# Patient Record
Sex: Male | Born: 1950 | Race: White | Hispanic: No | Marital: Married | Smoking: Former smoker
Health system: Southern US, Community
[De-identification: ages and names within clinical notes are randomized; demographics above are authoritative.]

## PROBLEM LIST (undated history)

## (undated) DIAGNOSIS — E785 Hyperlipidemia, unspecified: Secondary | ICD-10-CM

## (undated) DIAGNOSIS — D696 Thrombocytopenia, unspecified: Secondary | ICD-10-CM

## (undated) DIAGNOSIS — I82409 Acute embolism and thrombosis of unspecified deep veins of unspecified lower extremity: Secondary | ICD-10-CM

## (undated) DIAGNOSIS — I509 Heart failure, unspecified: Secondary | ICD-10-CM

## (undated) DIAGNOSIS — J449 Chronic obstructive pulmonary disease, unspecified: Secondary | ICD-10-CM

## (undated) DIAGNOSIS — I10 Essential (primary) hypertension: Secondary | ICD-10-CM

## (undated) DIAGNOSIS — F32A Depression, unspecified: Secondary | ICD-10-CM

## (undated) DIAGNOSIS — Z9989 Dependence on other enabling machines and devices: Secondary | ICD-10-CM

## (undated) DIAGNOSIS — E870 Hyperosmolality and hypernatremia: Secondary | ICD-10-CM

## (undated) DIAGNOSIS — J301 Allergic rhinitis due to pollen: Secondary | ICD-10-CM

## (undated) DIAGNOSIS — J15212 Pneumonia due to Methicillin resistant Staphylococcus aureus: Secondary | ICD-10-CM

## (undated) DIAGNOSIS — I4901 Ventricular fibrillation: Secondary | ICD-10-CM

## (undated) DIAGNOSIS — F419 Anxiety disorder, unspecified: Secondary | ICD-10-CM

## (undated) DIAGNOSIS — F1020 Alcohol dependence, uncomplicated: Secondary | ICD-10-CM

## (undated) DIAGNOSIS — I7789 Other specified disorders of arteries and arterioles: Secondary | ICD-10-CM

## (undated) DIAGNOSIS — F329 Major depressive disorder, single episode, unspecified: Secondary | ICD-10-CM

## (undated) DIAGNOSIS — R0603 Acute respiratory distress: Secondary | ICD-10-CM

## (undated) DIAGNOSIS — G4733 Obstructive sleep apnea (adult) (pediatric): Secondary | ICD-10-CM

## (undated) HISTORY — DX: Allergic rhinitis due to pollen: J30.1

## (undated) HISTORY — DX: Thrombocytopenia, unspecified: D69.6

## (undated) HISTORY — DX: Acute respiratory distress: R06.03

## (undated) HISTORY — DX: Hyperlipidemia, unspecified: E78.5

## (undated) HISTORY — PX: BACK SURGERY: SHX140

## (undated) HISTORY — DX: Other specified disorders of arteries and arterioles: I77.89

## (undated) HISTORY — DX: Pneumonia due to methicillin resistant Staphylococcus aureus: J15.212

## (undated) HISTORY — DX: Essential (primary) hypertension: I10

## (undated) HISTORY — PX: HERNIA REPAIR: SHX51

## (undated) HISTORY — PX: FRACTURE SURGERY: SHX138

## (undated) HISTORY — DX: Ventricular fibrillation: I49.01

## (undated) HISTORY — DX: Acute embolism and thrombosis of unspecified deep veins of unspecified lower extremity: I82.409

## (undated) HISTORY — DX: Hyperosmolality and hypernatremia: E87.0

## (undated) HISTORY — PX: CARPAL TUNNEL RELEASE: SHX101

## (undated) HISTORY — PX: INGUINAL HERNIA REPAIR: SUR1180

## (undated) HISTORY — PX: TONSILLECTOMY: SUR1361

## (undated) HISTORY — PX: LUMBAR DISC SURGERY: SHX700

## (undated) HISTORY — DX: Alcohol dependence, uncomplicated: F10.20

## (undated) HISTORY — DX: Chronic obstructive pulmonary disease, unspecified: J44.9

---

## 1984-07-23 HISTORY — PX: TIBIA FRACTURE SURGERY: SHX806

## 1999-09-15 ENCOUNTER — Ambulatory Visit (HOSPITAL_COMMUNITY): Admission: RE | Admit: 1999-09-15 | Discharge: 1999-09-15 | Payer: Self-pay | Admitting: Cardiology

## 1999-12-19 ENCOUNTER — Encounter: Payer: Self-pay | Admitting: General Surgery

## 1999-12-19 ENCOUNTER — Encounter: Admission: RE | Admit: 1999-12-19 | Discharge: 1999-12-19 | Payer: Self-pay | Admitting: General Surgery

## 1999-12-21 ENCOUNTER — Ambulatory Visit (HOSPITAL_BASED_OUTPATIENT_CLINIC_OR_DEPARTMENT_OTHER): Admission: RE | Admit: 1999-12-21 | Discharge: 1999-12-21 | Payer: Self-pay | Admitting: General Surgery

## 1999-12-21 ENCOUNTER — Encounter (INDEPENDENT_AMBULATORY_CARE_PROVIDER_SITE_OTHER): Payer: Self-pay | Admitting: *Deleted

## 2000-05-08 ENCOUNTER — Ambulatory Visit (HOSPITAL_COMMUNITY): Admission: RE | Admit: 2000-05-08 | Discharge: 2000-05-08 | Payer: Self-pay | Admitting: Gastroenterology

## 2000-05-08 ENCOUNTER — Encounter (INDEPENDENT_AMBULATORY_CARE_PROVIDER_SITE_OTHER): Payer: Self-pay | Admitting: Specialist

## 2001-02-08 ENCOUNTER — Emergency Department (HOSPITAL_COMMUNITY): Admission: EM | Admit: 2001-02-08 | Discharge: 2001-02-08 | Payer: Self-pay | Admitting: Emergency Medicine

## 2001-12-31 ENCOUNTER — Encounter: Admission: RE | Admit: 2001-12-31 | Discharge: 2001-12-31 | Payer: Self-pay | Admitting: Internal Medicine

## 2001-12-31 ENCOUNTER — Encounter: Payer: Self-pay | Admitting: Internal Medicine

## 2004-11-07 ENCOUNTER — Encounter: Admission: RE | Admit: 2004-11-07 | Discharge: 2004-11-07 | Payer: Self-pay | Admitting: Internal Medicine

## 2004-12-12 ENCOUNTER — Encounter: Admission: RE | Admit: 2004-12-12 | Discharge: 2004-12-12 | Payer: Self-pay | Admitting: Orthopedic Surgery

## 2008-12-01 ENCOUNTER — Encounter: Payer: Self-pay | Admitting: Pulmonary Disease

## 2009-07-19 ENCOUNTER — Encounter: Payer: Self-pay | Admitting: Pulmonary Disease

## 2009-07-23 DIAGNOSIS — I4901 Ventricular fibrillation: Secondary | ICD-10-CM

## 2009-07-23 HISTORY — DX: Ventricular fibrillation: I49.01

## 2009-08-29 ENCOUNTER — Encounter: Payer: Self-pay | Admitting: Pulmonary Disease

## 2009-09-01 DIAGNOSIS — J301 Allergic rhinitis due to pollen: Secondary | ICD-10-CM | POA: Insufficient documentation

## 2009-09-02 ENCOUNTER — Ambulatory Visit: Payer: Self-pay | Admitting: Pulmonary Disease

## 2009-09-02 DIAGNOSIS — R05 Cough: Secondary | ICD-10-CM

## 2009-09-02 DIAGNOSIS — J310 Chronic rhinitis: Secondary | ICD-10-CM | POA: Insufficient documentation

## 2009-09-02 DIAGNOSIS — E785 Hyperlipidemia, unspecified: Secondary | ICD-10-CM | POA: Insufficient documentation

## 2009-09-02 DIAGNOSIS — I10 Essential (primary) hypertension: Secondary | ICD-10-CM

## 2009-09-23 ENCOUNTER — Ambulatory Visit: Payer: Self-pay | Admitting: Pulmonary Disease

## 2009-09-29 ENCOUNTER — Ambulatory Visit: Payer: Self-pay | Admitting: Pulmonary Disease

## 2009-09-29 ENCOUNTER — Ambulatory Visit: Payer: Self-pay | Admitting: Internal Medicine

## 2009-09-29 ENCOUNTER — Inpatient Hospital Stay (HOSPITAL_COMMUNITY): Admission: EM | Admit: 2009-09-29 | Discharge: 2009-10-21 | Payer: Self-pay | Admitting: Emergency Medicine

## 2009-09-30 ENCOUNTER — Encounter (INDEPENDENT_AMBULATORY_CARE_PROVIDER_SITE_OTHER): Payer: Self-pay | Admitting: Pulmonary Disease

## 2009-10-07 ENCOUNTER — Ambulatory Visit: Payer: Self-pay | Admitting: Internal Medicine

## 2009-10-20 ENCOUNTER — Ambulatory Visit: Payer: Self-pay | Admitting: Surgery

## 2009-10-20 ENCOUNTER — Encounter: Payer: Self-pay | Admitting: Critical Care Medicine

## 2009-10-24 ENCOUNTER — Ambulatory Visit: Payer: Self-pay | Admitting: Cardiovascular Disease

## 2009-10-24 LAB — CONVERTED CEMR LAB: POC INR: 3.4

## 2009-10-28 ENCOUNTER — Ambulatory Visit: Payer: Self-pay | Admitting: Internal Medicine

## 2009-10-28 ENCOUNTER — Ambulatory Visit: Payer: Self-pay | Admitting: Cardiology

## 2009-10-28 DIAGNOSIS — F1010 Alcohol abuse, uncomplicated: Secondary | ICD-10-CM | POA: Insufficient documentation

## 2009-10-28 DIAGNOSIS — I469 Cardiac arrest, cause unspecified: Secondary | ICD-10-CM | POA: Insufficient documentation

## 2009-10-28 DIAGNOSIS — E876 Hypokalemia: Secondary | ICD-10-CM

## 2009-10-28 DIAGNOSIS — Z8639 Personal history of other endocrine, nutritional and metabolic disease: Secondary | ICD-10-CM

## 2009-10-28 DIAGNOSIS — I428 Other cardiomyopathies: Secondary | ICD-10-CM

## 2009-10-28 DIAGNOSIS — Z862 Personal history of diseases of the blood and blood-forming organs and certain disorders involving the immune mechanism: Secondary | ICD-10-CM

## 2009-10-31 LAB — CONVERTED CEMR LAB
Calcium: 9.1 mg/dL (ref 8.4–10.5)
Chloride: 102 meq/L (ref 96–112)
Creatinine, Ser: 0.8 mg/dL (ref 0.4–1.5)
GFR calc non Af Amer: 104.56 mL/min (ref >60)
Glucose, Bld: 92 mg/dL (ref 70–99)
Magnesium: 1.8 mg/dL (ref 1.5–2.5)
Sodium: 138 meq/L (ref 135–145)

## 2009-11-02 ENCOUNTER — Ambulatory Visit: Payer: Self-pay | Admitting: Internal Medicine

## 2009-11-02 ENCOUNTER — Telehealth: Payer: Self-pay | Admitting: Internal Medicine

## 2009-11-02 LAB — CONVERTED CEMR LAB: POC INR: 3.1

## 2009-11-08 ENCOUNTER — Ambulatory Visit: Payer: Self-pay | Admitting: Cardiovascular Disease

## 2009-11-08 LAB — CONVERTED CEMR LAB: POC INR: 2.1

## 2009-11-16 ENCOUNTER — Ambulatory Visit: Payer: Self-pay | Admitting: Internal Medicine

## 2009-11-20 DEATH — deceased

## 2009-11-24 ENCOUNTER — Ambulatory Visit: Payer: Self-pay | Admitting: Cardiology

## 2009-11-24 ENCOUNTER — Ambulatory Visit: Payer: Self-pay | Admitting: Internal Medicine

## 2009-11-25 LAB — CONVERTED CEMR LAB
CO2: 29 meq/L (ref 19–32)
Calcium: 9.5 mg/dL (ref 8.4–10.5)
Chloride: 102 meq/L (ref 96–112)
Glucose, Bld: 94 mg/dL (ref 70–99)
Magnesium: 2.2 mg/dL (ref 1.5–2.5)
Potassium: 4.6 meq/L (ref 3.5–5.1)

## 2009-11-28 ENCOUNTER — Encounter: Payer: Self-pay | Admitting: Internal Medicine

## 2009-12-09 ENCOUNTER — Ambulatory Visit: Payer: Self-pay | Admitting: Internal Medicine

## 2009-12-09 ENCOUNTER — Encounter (INDEPENDENT_AMBULATORY_CARE_PROVIDER_SITE_OTHER): Payer: Self-pay | Admitting: *Deleted

## 2009-12-09 ENCOUNTER — Encounter: Payer: Self-pay | Admitting: Internal Medicine

## 2009-12-09 ENCOUNTER — Ambulatory Visit: Payer: Self-pay | Admitting: Cardiology

## 2009-12-09 ENCOUNTER — Ambulatory Visit: Payer: Self-pay

## 2009-12-09 ENCOUNTER — Ambulatory Visit (HOSPITAL_COMMUNITY): Admission: RE | Admit: 2009-12-09 | Discharge: 2009-12-09 | Payer: Self-pay | Admitting: Internal Medicine

## 2009-12-16 ENCOUNTER — Encounter: Payer: Self-pay | Admitting: Internal Medicine

## 2009-12-20 ENCOUNTER — Ambulatory Visit: Payer: Self-pay | Admitting: Internal Medicine

## 2009-12-29 ENCOUNTER — Ambulatory Visit: Payer: Self-pay

## 2009-12-29 ENCOUNTER — Ambulatory Visit: Payer: Self-pay | Admitting: Internal Medicine

## 2009-12-29 LAB — CONVERTED CEMR LAB: POC INR: 2.4

## 2009-12-30 LAB — CONVERTED CEMR LAB
CO2: 28 meq/L (ref 19–32)
Calcium: 9.2 mg/dL (ref 8.4–10.5)
GFR calc non Af Amer: 92.4 mL/min (ref >60)
Glucose, Bld: 123 mg/dL — ABNORMAL HIGH (ref 70–99)
Potassium: 3.8 meq/L (ref 3.5–5.1)
Sodium: 140 meq/L (ref 135–145)

## 2010-01-06 ENCOUNTER — Telehealth: Payer: Self-pay | Admitting: Internal Medicine

## 2010-01-26 ENCOUNTER — Ambulatory Visit: Payer: Self-pay | Admitting: Internal Medicine

## 2010-02-09 ENCOUNTER — Ambulatory Visit: Payer: Self-pay | Admitting: Cardiology

## 2010-02-09 LAB — CONVERTED CEMR LAB: POC INR: 1.8

## 2010-02-21 ENCOUNTER — Ambulatory Visit: Payer: Self-pay | Admitting: Cardiology

## 2010-02-28 ENCOUNTER — Telehealth (INDEPENDENT_AMBULATORY_CARE_PROVIDER_SITE_OTHER): Payer: Self-pay | Admitting: *Deleted

## 2010-03-08 ENCOUNTER — Ambulatory Visit: Payer: Self-pay | Admitting: Cardiology

## 2010-03-08 LAB — CONVERTED CEMR LAB: POC INR: 2.2

## 2010-03-24 ENCOUNTER — Ambulatory Visit: Payer: Self-pay | Admitting: Internal Medicine

## 2010-03-24 LAB — CONVERTED CEMR LAB: POC INR: 2.3

## 2010-04-13 ENCOUNTER — Ambulatory Visit: Payer: Self-pay | Admitting: Internal Medicine

## 2010-04-13 DIAGNOSIS — L270 Generalized skin eruption due to drugs and medicaments taken internally: Secondary | ICD-10-CM | POA: Insufficient documentation

## 2010-04-27 ENCOUNTER — Encounter: Payer: Self-pay | Admitting: Internal Medicine

## 2010-04-27 ENCOUNTER — Ambulatory Visit: Payer: Self-pay | Admitting: Internal Medicine

## 2010-04-27 LAB — CONVERTED CEMR LAB
Calcium: 9.2 mg/dL (ref 8.4–10.5)
Creatinine, Ser: 1 mg/dL (ref 0.4–1.5)
Sodium: 135 meq/L (ref 135–145)

## 2010-04-28 ENCOUNTER — Telehealth: Payer: Self-pay | Admitting: Internal Medicine

## 2010-05-08 ENCOUNTER — Encounter: Payer: Self-pay | Admitting: Internal Medicine

## 2010-07-23 DIAGNOSIS — J15212 Pneumonia due to Methicillin resistant Staphylococcus aureus: Secondary | ICD-10-CM

## 2010-07-23 HISTORY — DX: Pneumonia due to methicillin resistant Staphylococcus aureus: J15.212

## 2010-08-13 ENCOUNTER — Encounter: Payer: Self-pay | Admitting: Internal Medicine

## 2010-08-22 NOTE — Letter (Signed)
Summary: [facility]   Imported By: Marylou Mccoy 05/26/2010 11:49:52  _____________________________________________________________________  External Attachment:    Type:   Image     Comment:   External Document

## 2010-08-22 NOTE — Miscellaneous (Signed)
  Clinical Lists Changes  Observations: Added new observation of ECHOINTERP:  - Left ventricle: There is some hypokinesis of the lateral apical       segment. The EF is 55%. Diastolic function is difficult to assess,       but no definite abnormality is noted. The cavity size was mildly       dilated. Wall thickness was normal.     - Aortic valve: Trivial regurgitation.     - Aorta: The aorta was mildly dilated.     - Right ventricle: The cavity size was mildly dilated. Systolic       function was mildly reduced.     - Tricuspid valve: No regurgitation. RV pressure can not be       estimated. (12/09/2009 15:16)      Echocardiogram  Procedure date:  12/09/2009  Findings:       - Left ventricle: There is some hypokinesis of the lateral apical       segment. The EF is 55%. Diastolic function is difficult to assess,       but no definite abnormality is noted. The cavity size was mildly       dilated. Wall thickness was normal.     - Aortic valve: Trivial regurgitation.     - Aorta: The aorta was mildly dilated.     - Right ventricle: The cavity size was mildly dilated. Systolic       function was mildly reduced.     - Tricuspid valve: No regurgitation. RV pressure can not be       estimated.

## 2010-08-22 NOTE — Medication Information (Signed)
Summary: rov/eac  Anticoagulant Therapy  Managed by: Elaina Pattee, PharmD Referring MD: Graciela Husbands MD, Viviann Spare PCP: Dr. Jodie Echevaria MD: Tenny Craw MD, Gunnar Fusi Indication 1: DVT Lab Used: [facility] [place] Site: [address] INR POC 2.4 INR RANGE 2.0-3.0  Dietary changes: no    Health status changes: no    Bleeding/hemorrhagic complications: no    Recent/future hospitalizations: no    Any changes in medication regimen? yes       Details: Changed potassium dose.  DC spironolactone.  Recent/future dental: no  Any missed doses?: no       Is patient compliant with meds? yes       Allergies: 1)  Amoxicillin  Anticoagulation Management History:      The patient is taking warfarin and comes in today for a routine follow up visit.  Negative risk factors for bleeding include an age less than 74 years old.  The bleeding index is 'low risk'.  Positive CHADS2 values include History of HTN.  Negative CHADS2 values include Age > 15 years old.  Anticoagulation responsible provider: Tenny Craw MD, Gunnar Fusi.  INR POC: 2.4.  Cuvette Lot#: [id number].  Exp: 02/2011.    Anticoagulation Management Assessment/Plan:      The patient's current anticoagulation dose is Warfarin sodium 5 mg tabs: Use as directed by Anticoagulation Clinic.  The target INR is 2.0-3.0.  The next INR is due 01/26/2010.  Anticoagulation instructions were given to patient.  Results were reviewed/authorized by Elaina Pattee, PharmD.  He was notified by Elaina Pattee, PharmD.         Prior Anticoagulation Instructions: INR 2.1  Take 1 tablet today.  Continue taking 1 tablet on Monday and Thursday and 1/2 tablet all other days.  Return to clinic in 3 weeks.    Current Anticoagulation Instructions: INR 2.4. Take 0.5 tablet daily except 1 tablet on Monday and Thursday. Recheck in 4 weeks.

## 2010-08-22 NOTE — Medication Information (Signed)
Summary: rov/ewj  Anticoagulant Therapy  Managed by: Bethena Midget, RN, BSN Referring MD: Graciela Husbands MD, Viviann Spare PCP: Dr. Jodie Echevaria MD: Gala Romney MD, Reuel Boom Indication 1: DVT Lab Used: [facility] [place] Site: [address] INR POC 2.2 INR RANGE 2.0-3.0  Dietary changes: no    Health status changes: no    Bleeding/hemorrhagic complications: no    Recent/future hospitalizations: no    Any changes in medication regimen? no    Recent/future dental: no  Any missed doses?: no       Is patient compliant with meds? yes       Allergies: 1)  Amoxicillin  Anticoagulation Management History:      The patient is taking warfarin and comes in today for a routine follow up visit.  Negative risk factors for bleeding include an age less than 37 years old.  The bleeding index is 'low risk'.  Positive CHADS2 values include History of HTN.  Negative CHADS2 values include Age > 3 years old.  Anticoagulation responsible provider: Bensimhon MD, Reuel Boom.  INR POC: 2.2.  Cuvette Lot#: [id number].  Exp: 12/2010.    Anticoagulation Management Assessment/Plan:      The patient's current anticoagulation dose is Warfarin sodium 5 mg tabs: Use as directed by Anticoagulation Clinic.  The target INR is 2.0-3.0.  The next INR is due 11/24/2009.  Anticoagulation instructions were given to patient.  Results were reviewed/authorized by Bethena Midget, RN, BSN.  He was notified by Bethena Midget, RN, BSN.         Prior Anticoagulation Instructions: INR 2.1  Continue on same dosage 1/2 tablet daily except 1 tablet on Mondays.  Recheck in 1 week.    Current Anticoagulation Instructions: INR 2.2 Continue 2.5mg  everyday except 5mg s on Mondays. Recheck in one week.

## 2010-08-22 NOTE — Progress Notes (Signed)
Summary: question about lab results from 10/28/09/**LM/nm  Phone Note Call from Patient   Caller: Spouse Call For: Graciela Husbands Reason for Call: Talk to Doctor Summary of Call: Pt never received call back from labwork done on 10/28/09.  Pt is currently on Magnesium oxide 400mg  once daily. Per lab results Dr Graciela Husbands instructed to add mag replacement.  Please advise if pt needs to change amt of current dosage.  Pt also reports at the time lab done pt was taking potassium not as reported at visit.  Pt is now currently taking rx amt two times a day. Please call pt and or wife and advise.  Thanks.   Initial call taken by: Cloyde Reams RN,  November 02, 2009 12:10 PM  Follow-up for Phone Call        [facility]. Ollen Gross, RN, BSN  November 02, 2009 12:30 PM Pt's wife called back. She states prior blood work was done pt. was taken only 20 MEg  once a day, instead of 40 MEQ daily as order. Pt states she thought it was 40 MEQ tablets that pt. had. Now she is given pt. 20 MEQ twice a day. Pt's wife would like to know since K+ was 4.3 does he needs the 20 meg once  a day instead of twice a day?Ollen Gross, RN, BSN  November 02, 2009 1:39 PM Left a message to pt,s wife. Amber Dr. Odessa Fleming nurse will f/u medication with Dr. Graciela Husbands. Amber will call you back as soon as she finds out. Ollen Gross, RN, BSN  November 02, 2009 2:11 PM     Additional Follow-up for Phone Call Additional follow up Details #1::        continue talking potassium as he was prior to blood work and just confirm that it was 20 meq.  As it relates to [facility], he needs to continue taking supplements sthanks Additional Follow-up by: Nathen May, MD, [facility],  November 04, 2009 1:42 PM     Appended Document: question about lab results from 10/28/09/**LM/nm Pt notified.

## 2010-08-22 NOTE — Miscellaneous (Signed)
Summary: [facility]  [facility]   Imported By: Maryln Gottron 10/17/2009 12:27:28  _____________________________________________________________________  External Attachment:    Type:   Image     Comment:   External Document

## 2010-08-22 NOTE — Medication Information (Signed)
Summary: rov/tm  Anticoagulant Therapy  Managed by: Bethena Midget, RN, BSN Referring MD: Graciela Husbands MD, Viviann Spare PCP: Dr. Jodie Echevaria MD: Shirlee Latch MD, Alawna Graybeal Indication 1: DVT Lab Used: [facility] [place] Site: [address] INR POC 1.8 INR RANGE 2.0-3.0  Dietary changes: no    Health status changes: no    Bleeding/hemorrhagic complications: no    Recent/future hospitalizations: no    Any changes in medication regimen? yes       Details: Zoloft added today.    Any missed doses?: no       Is patient compliant with meds? yes      Comments: Saw Dr. Graciela Husbands today.  Allergies: 1)  Amoxicillin  Anticoagulation Management History:      The patient is taking warfarin and comes in today for a routine follow up visit.  Negative risk factors for bleeding include an age less than 83 years old.  The bleeding index is 'low risk'.  Positive CHADS2 values include History of HTN.  Negative CHADS2 values include Age > 27 years old.  Anticoagulation responsible provider: Shirlee Latch MD, Evani Shrider.  INR POC: 1.8.  Cuvette Lot#: [id number].  Exp: 12/2010.    Anticoagulation Management Assessment/Plan:      The patient's current anticoagulation dose is Warfarin sodium 5 mg tabs: Use as directed by Anticoagulation Clinic.  The target INR is 2.0-3.0.  The next INR is due 12/08/2009.  Anticoagulation instructions were given to patient.  Results were reviewed/authorized by Bethena Midget, RN, BSN.  He was notified by Bethena Midget, RN, BSN.         Prior Anticoagulation Instructions: INR 2.2 Continue 2.5mg  everyday except 5mg s on Mondays. Recheck in one week.   Current Anticoagulation Instructions: INR 1.8 Change dose to 2.5mg s daily except 5mg s on Mondays and Thursdays. Recheck in 2 weeks.

## 2010-08-22 NOTE — Assessment & Plan Note (Signed)
Summary: rs missed 3 month reck/mt   Visit Type:  Follow-up Referring Provider:  Dr. Sidney Ace Primary Provider:  Dr. Donette Larry  CC:  swelling.  History of Present Illness: Mr. Sellitto is seen in followup for cardiac arrest occurring in the setting of profound hypokalemia occurring in the context of chronic alcohol abuse.  Catheterization demonstrated no obstructive disease. There recalls ejection fraction is about 45-50%. There have been no recurrent arrhythmias.  . 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 However, his hospital course was complicated by DVT in his left lower leg requiring Coumadin which then precluded inpatient rehabilitation. He is involved in outpatient rehabilitation.    he is in pursuing AA and outpatient consultation at the [facility]. and he has remained sober now for about 90 days.  He denies chest pain shortness of breath; there is some edema on his left leg which is its lateral to the DVT. He has been on anticoagulation for about 2 months  His echo cardiogram done last week demonstrates normalization of left ventricular systolic  He has put on 20 lbs ;  he has recently started diovan after ace was stopped because of cough.  He has developed some lower extremity edema and a rash  Current Medications (verified): 1)  Singulair 10 Mg Tabs (Montelukast Sodium) .... As Needed 2)  Xopenex Hfa 45 Mcg/act Aero (Levalbuterol Tartrate) .... 2 Puffs Every 4 To 6 Hours As Needed 3)  Allergy Vaccine 4)  Epipen .... As Directed 5)  Astepro 0.15 % Soln (Azelastine Hcl) .[name].. 1 Spray Two Times A Day As Needed 6)  Fish Oil 1000 Mg Caps (Omega-3 Fatty Acids) .... Two Times A Day 7)  Vitamin D (Ergocalciferol) 50000 Unit Caps (Ergocalciferol) .... Every Other Week 8)  Felodipine 5 Mg Xr24h-Tab (Felodipine) .... Two Times A Day 9)  Acetaminophen 325 Mg Tabs (Acetaminophen) .... Take 2 Tabs By Mouth Every 4 Hours As Needed 10)  Abilify 5 Mg  Tabs (Aripiprazole) .... Take 1 Tablet By Mouth Daily 11)  Coreg 6.25 Mg Tabs (Carvedilol) .... Take One Tablet By Mouth Two Times A Day 12)  Warfarin Sodium 5 Mg Tabs (Warfarin Sodium) .... Use As Directed By Anticoagulation Clinic 13)  Mag-Oxide 400 Mg Tabs (Magnesium Oxide) .... Take 1 Tablet By Mouth Daily 14)  Potassium Chloride Crys Cr 20 Meq Cr-Tabs (Potassium Chloride Crys Cr) .... Take 1 Tablet  By Mouth Daily 15)  Vitamin B-1 100 Mg Tabs (Thiamine Hcl) .... Take 1 Tablet By Mouth Daily 16)  Omeprazole 20 Mg Cpdr (Omeprazole) .[name].. 1 Tab Two Times A Day 17)  Benadryl 25 Mg Tabs (Diphenhydramine Hcl) .... As Needed Allergies 18)  Zoloft 50 Mg Tabs (Sertraline Hcl) .... Take One Tablet By Mouth Once Daily. 19)  Ambien 10 Mg Tabs (Zolpidem Tartrate) .... As Needed 20)  Diovan Hct 80-12.5 Mg Tabs (Valsartan-Hydrochlorothiazide) .... Once Daily 21)  Tylenol Extra Strength 500 Mg Tabs (Acetaminophen) .... 2 Tabs Daily  Allergies: 1)  ! * Lisinopril 2)  Amoxicillin  Past History:  Past Medical History: Last updated: 10/27/2009 HYPERLIPIDEMIA (ICD-272.4) HYPERTENSION (ICD-401.9) ALLERGIC RHINITIS, SEASONAL (ICD-477.0) Ventricular fibrillation leading onto cardiac arrest-2011 Acute respiratory distress Drug withdrawal syndrome and alcohol withdrawal Hypernatremia Propionibacterium bacteremia 1 set of the blood culture on September 29, 2009.  Methicillin-susceptible Staphylococcus aureus pneumonia.  Thrombocytopenia.  Left calf vein deep vein thrombosis and left greater saphenous vein       deep  vein thrombosis History of CAD History of chronic obstructive pulmonary disease History of alcohol dependence History of anxiety disorder        Vital Signs:  Patient profile:   60 year old male Height:      71 inches Weight:      250 pounds BMI:     34.99 Pulse rate:   60 / minute BP sitting:   130 / 80  (left arm)  Vitals Entered By: Laurance Flatten CMA (April 13, 2010  4:17 PM)  Physical Exam  General:  The patient was alert and oriented in no acute distress. HEENT Normal.  Neck veins were flat, carotids were brisk.  Lungs were clear.  Heart sounds were regular without murmurs or gallops.  Abdomen was soft with active bowel sounds. There is no clubbing cyanosis; trace edema skin warma nd dry with eryhtmatous rash over lower extremities Skin Warm and dry     Impression & Recommendations:  Problem # 1:  CUTANEOUS ERUPTIONS, DRUG-INDUCED (ICD-693.0) because of the temporal relationship to diovan exposure I presume this is the culprit.  we will stop it.  Becasue of the edema we will put him on low dose lasix and will plan to check potassium levels in about two weeks  Problem # 2:  HYPOKALEMIA, HX OF (ICD-V12.2) see above  Problem # 3:  DVT-LEFT LEG (ICD-453.40) will discontinue coumadin after the seven months  Problem # 4:  ABORTED CARDIAC ARREST (ICD-427.41) no recurrent arrhtyhmia The following medications were removed from the medication list:    Warfarin Sodium 5 Mg Tabs (Warfarin sodium) ..... Use as directed by anticoagulation clinic    Lisinopril 10 Mg Tabs (Lisinopril) .[name]... Take one tablet by mouth daily His updated medication list for this problem includes:    Felodipine 5 Mg Xr24h-tab (Felodipine) .[name]..[name] Two times a day    Coreg 6.25 Mg Tabs (Carvedilol) .[name]... Take one tablet by mouth two times a day  Patient Instructions: 1)  Your physician recommends that you schedule a follow-up appointment in: 6 months. 2)  Your physician has recommended you make the following change in your medication: Stop Warfarin and Diovan-Hct. 3)  Start Furosemide 20mg  once daily. 4)  Your physician recommends that you return for lab work in: 2 weeks  5)  Your physician wants you to follow-up in: 6 months   You will receive a reminder letter in the mail two months in advance. If you don't receive a letter, please call our office to schedule the follow-up  appointment. Prescriptions: FUROSEMIDE 20 MG TABS (FUROSEMIDE) once daily  #30 x 11   Entered by:   Claris Gladden RN   Authorized by:   Nathen May, MD, [facility]   Signed by:   Claris Gladden RN on 04/13/2010   Method used:   Electronically to        [facility]* (retail)       [address] [place], [place]  [address]       Ph: [phone] or [phone]       Fax: [phone]   RxID:   [id number]

## 2010-08-22 NOTE — Medication Information (Signed)
Summary: rov/cb  Anticoagulant Therapy  Managed by: Cloyde Reams, RN, BSN Referring MD: Graciela Husbands MD, Viviann Spare PCP: Dr. Jodie Echevaria MD: Johney Frame MD, Fayrene Fearing Indication 1: DVT Lab Used: [facility] [place] Site: [address] INR POC 3.1 INR RANGE 2.0-3.0  Dietary changes: no    Health status changes: no    Bleeding/hemorrhagic complications: yes       Details: Occ blood tinged mucus when blows nose and occ has blood tinged sputum when coughs, scant amt per pt report.  Hx of prolonged intubation.    Recent/future hospitalizations: no    Any changes in medication regimen? no    Recent/future dental: no  Any missed doses?: no       Is patient compliant with meds? yes       Allergies (verified): 1)  Amoxicillin  Anticoagulation Management History:      The patient is taking warfarin and comes in today for a routine follow up visit.  Negative risk factors for bleeding include an age less than 24 years old.  The bleeding index is 'low risk'.  Positive CHADS2 values include History of HTN.  Negative CHADS2 values include Age > 18 years old.  Anticoagulation responsible provider: Kaitlynd Phillips MD, Fayrene Fearing.  INR POC: 3.1.  Cuvette Lot#: [id number].  Exp: 11/2010.    Anticoagulation Management Assessment/Plan:      The patient's current anticoagulation dose is Warfarin sodium 5 mg tabs: Use as directed by Anticoagulation Clinic.  The target INR is 2.0-3.0.  The next INR is due 11/08/2009.  Anticoagulation instructions were given to patient.  Results were reviewed/authorized by Cloyde Reams, RN, BSN.  He was notified by Cloyde Reams RN.         Prior Anticoagulation Instructions: INR 1.9. Take 0.5 tablet daily, except 1 tablet on Mondays and Fridays. Recheck on Wednesday.  Current Anticoagulation Instructions: INR 3.1  Start taking 1/2 tablet daily except 1 tablet on Mondays.  Recheck in 1 week.

## 2010-08-22 NOTE — Medication Information (Signed)
Summary: coumadin ck/rs missed appt/mt  Anticoagulant Therapy  Managed by: Weston Brass, PharmD Referring MD: Graciela Husbands MD, Viviann Spare PCP: Dr. Jodie Echevaria MD: Juanda Chance MD, Bruce Indication 1: DVT Lab Used: [facility] [place] Site: [address] INR POC 2.3 INR RANGE 2.0-3.0  Dietary changes: yes       Details: more salad than usual  Health status changes: no    Bleeding/hemorrhagic complications: no    Recent/future hospitalizations: no    Any changes in medication regimen? yes       Details: started diovan  Recent/future dental: no  Any missed doses?: no       Is patient compliant with meds? yes       Allergies: 1)  Amoxicillin  Anticoagulation Management History:      Negative risk factors for bleeding include an age less than 67 years old.  The bleeding index is 'low risk'.  Positive CHADS2 values include History of HTN.  Negative CHADS2 values include Age > 48 years old.  Anticoagulation responsible provider: Juanda Chance MD, Smitty Cords.  INR POC: 2.3.  Cuvette Lot#: [id number].  Exp: 04/2011.    Anticoagulation Management Assessment/Plan:      The patient's current anticoagulation dose is Warfarin sodium 5 mg tabs: Use as directed by Anticoagulation Clinic.  The target INR is 2.0-3.0.  The next INR is due 04/21/2010.  Anticoagulation instructions were given to patient.  Results were reviewed/authorized by Weston Brass, PharmD.  He was notified by Lyna Poser PharmD.         Prior Anticoagulation Instructions: INR 2.2  Continue taking 1 tablet (5mg ) every day except take 1/2 tablet (2.5mg ) on Sundays, Tuesdays, and Thursdays.  Recheck in 2 weeks.   Current Anticoagulation Instructions: INR 2.3 Continue taking 1/2 tablets on sunday, tuesday, thursday. Take 1 tablet on monday, wednesday, friday, and saturday. See you in 4 weeks.

## 2010-08-22 NOTE — Medication Information (Signed)
Summary: rov/tm  Anticoagulant Therapy  Managed by: Eda Keys, PharmD Referring MD: Graciela Husbands MD, Viviann Spare PCP: Dr. Jodie Echevaria MD: Tenny Craw MD, Gunnar Fusi Indication 1: DVT Lab Used: [facility] [place] Site: [address] INR POC 2.1 INR RANGE 2.0-3.0  Dietary changes: no    Health status changes: no    Bleeding/hemorrhagic complications: no    Recent/future hospitalizations: no    Any changes in medication regimen? no    Recent/future dental: no  Any missed doses?: no       Is patient compliant with meds? yes       Allergies: 1)  Amoxicillin  Anticoagulation Management History:      The patient is taking warfarin and comes in today for a routine follow up visit.  Negative risk factors for bleeding include an age less than 78 years old.  The bleeding index is 'low risk'.  Positive CHADS2 values include History of HTN.  Negative CHADS2 values include Age > 36 years old.  Anticoagulation responsible provider: Tenny Craw MD, Gunnar Fusi.  INR POC: 2.1.  Cuvette Lot#: [id number].  Exp: 02/2011.    Anticoagulation Management Assessment/Plan:      The patient's current anticoagulation dose is Warfarin sodium 5 mg tabs: Use as directed by Anticoagulation Clinic.  The target INR is 2.0-3.0.  The next INR is due 12/30/2009.  Anticoagulation instructions were given to patient.  Results were reviewed/authorized by Eda Keys, PharmD.  He was notified by Eda Keys.         Prior Anticoagulation Instructions: INR 1.8 Change dose to 2.5mg s daily except 5mg s on Mondays and Thursdays. Recheck in 2 weeks.   Current Anticoagulation Instructions: INR 2.1  Take 1 tablet today.  Continue taking 1 tablet on Monday and Thursday and 1/2 tablet all other days.  Return to clinic in 3 weeks.

## 2010-08-22 NOTE — Letter (Signed)
Summary: Outpatient Coinsurance Notice  Outpatient Coinsurance Notice   Imported By: Marylou Mccoy 12/13/2009 09:49:46  _____________________________________________________________________  External Attachment:    Type:   Image     Comment:   External Document

## 2010-08-22 NOTE — Medication Information (Signed)
Summary: ccr  Anticoagulant Therapy  Managed by: Weston Brass PharmD Referring MD: Graciela Husbands MD, Viviann Spare PCP: Dr. Donette Larry Supervising MD: Clifton James MD, Cristal Deer Indication 1: DVT Lab Used: [facility] [place] Site: [address] INR POC 3.4 INR RANGE 2.0-3.0  Dietary changes: no    Health status changes: yes       Details: Pt complaining of agitation secondary to alcohol withdrawal.  He was suppose to enter an outpatient treatment center today but is delayed secondary to bleeding issues.  Advised pt to f/u with PCP   Bleeding/hemorrhagic complications: yes       Details: Pt having profuse nose bleed since Saturday.  Will stop for a few minutes but will restart if pt makes any abrupt movements.  Has appt with Dr. Ezzard Standing this afternoon  Recent/future hospitalizations: yes       Details: Discharged 4/1 after cardiac arrest, ARF and new DVT during hospitalization  Any changes in medication regimen? yes       Details: see med rec  Recent/future dental: no  Any missed doses?: no       Is patient compliant with meds? yes      Comments: Pt was discharged on Lovenox and Coumadin.  First dose of Coumadin last Thursday.  Took dose of Lovenox today- 5 days of overlap completed  Current Medications (verified): 1)  Singulair 10 Mg Tabs (Montelukast Sodium) .... Take 1 Tablet By Mouth Once A Day 2)  Xopenex Hfa 45 Mcg/act Aero (Levalbuterol Tartrate) .... 2 Puffs Every 4 To 6 Hours As Needed 3)  Allergy Vaccine 4)  Epipen .... As Directed 5)  Astepro 0.15 % Soln (Azelastine Hcl) .[name].. 1 Spray Two Times A Day 6)  Fish Oil 1000 Mg Caps (Omega-3 Fatty Acids) .... Once Daily 7)  Vitamin D (Ergocalciferol) 50000 Unit Caps (Ergocalciferol) .... Once Weekly 8)  Felodipine 5 Mg Xr24h-Tab (Felodipine) .... Two Times A Day 9)  Acetaminophen 325 Mg Tabs (Acetaminophen) .... Take 2 Tabs By Mouth Every 4 Hours As Needed 10)  Abilify 5 Mg Tabs (Aripiprazole) .... Take 1 Tablet By Mouth Daily 11)   Aspirin Ec 325 Mg Tbec (Aspirin) .... Take One Tablet By Mouth Daily 12)  Carvedilol 6.25 Mg Tabs (Carvedilol) .... Take One Tablet By Mouth Twice A Day 13)  Warfarin Sodium 5 Mg Tabs (Warfarin Sodium) .... Use As Directed By Anticoagulation Clinic 14)  Ensure  Liqd (Nutritional Supplements) .... Drink 1 Can Every 8 Hours 15)  Lisinopril 10 Mg Tabs (Lisinopril) .... Take One Tablet By Mouth Daily 16)  Mag-Oxide 400 Mg Tabs (Magnesium Oxide) .... Take 1 Tablet By Mouth Daily 17)  Percocet 5-325 Mg Tabs (Oxycodone-Acetaminophen) .... Take 1-2 Tablets By Mouth Every 3 Hours As Needed 18)  Potassium Chloride Crys Cr 20 Meq Cr-Tabs (Potassium Chloride Crys Cr) .... Take Two Tablets By Mouth Daily 19)  Spironolactone 25 Mg Tabs (Spironolactone) .... Take One Tablet By Mouth Daily 20)  Vitamin B-1 100 Mg Tabs (Thiamine Hcl) .... Take 1 Tablet By Mouth Daily 21)  Advair Diskus 500-50 Mcg/dose Aepb (Fluticasone-Salmeterol) .... Inhale 1 Puff By Mouth Daily 22)  Bentyl 10 Mg Caps (Dicyclomine Hcl) .... Take 1 Tablet By Mouth At Bedtime As Needed 23)  Omeprazole 20 Mg Cpdr (Omeprazole) .... Take 2 Capsules By Mouth Daily 24)  Spiriva Handihaler 18 Mcg Caps (Tiotropium Bromide Monohydrate) .... Inhale The Contents of 1 Capsule Daily  Allergies: 1)  Amoxicillin  Anticoagulation Management History:  The patient comes in today for his initial visit for anticoagulation therapy.  Negative risk factors for bleeding include an age less than 21 years old.  The bleeding index is 'low risk'.  Positive CHADS2 values include History of HTN.  Negative CHADS2 values include Age > 15 years old.  Anticoagulation responsible provider: Clifton James MD, Cristal Deer.  INR POC: 3.4.    Anticoagulation Management Assessment/Plan:      The patient's current anticoagulation dose is Warfarin sodium 5 mg tabs: Use as directed by Anticoagulation Clinic.  The target INR is 2.0-3.0.  The next INR is due 10/28/2009.  Anticoagulation  instructions were given to patient.  Results were reviewed/authorized by Weston Brass PharmD.  He was notified by Weston Brass PharmD.         Current Anticoagulation Instructions: INR 3.4  Stop taking Lovenox.  Do not take any Coumadin tonight.  Decrease Coumadin dose to 1/2 tablet every day

## 2010-08-22 NOTE — Medication Information (Signed)
Summary: rov/sp  Anticoagulant Therapy  Managed by: Elaina Pattee, PharmD Referring MD: Graciela Husbands MD, Viviann Spare PCP: Dr. Jodie Echevaria MD: Daleen Squibb MD, Maisie Fus Indication 1: DVT Lab Used: [facility] [place] Site: [address] INR POC 1.9 INR RANGE 2.0-3.0  Dietary changes: no    Health status changes: yes       Details: Chest is still sore from cardiac arrest.  Bleeding/hemorrhagic complications: yes       Details: Nose bleeds stopped after cauterization and DC Lovenox.  Recent/future hospitalizations: no    Any changes in medication regimen? yes       Details: Pt has been taking Clarinex, but is out of this now.  Recent/future dental: no  Any missed doses?: no       Is patient compliant with meds? yes      Comments: Pt in outpatient rehab at [facility] for alcohol withdrawal.  Allergies: 1)  Amoxicillin  Anticoagulation Management History:      The patient is taking warfarin and comes in today for a routine follow up visit.  Negative risk factors for bleeding include an age less than 93 years old.  The bleeding index is 'low risk'.  Positive CHADS2 values include History of HTN.  Negative CHADS2 values include Age > 62 years old.  Anticoagulation responsible provider: Daleen Squibb MD, Maisie Fus.  INR POC: 1.9.  Cuvette Lot#: [id number].  Exp: 11/2010.    Anticoagulation Management Assessment/Plan:      The patient's current anticoagulation dose is Warfarin sodium 5 mg tabs: Use as directed by Anticoagulation Clinic.  The target INR is 2.0-3.0.  The next INR is due 11/02/2009.  Anticoagulation instructions were given to patient.  Results were reviewed/authorized by Elaina Pattee, PharmD.  He was notified by Elaina Pattee, PharmD.         Prior Anticoagulation Instructions: INR 3.4  Stop taking Lovenox.  Do not take any Coumadin tonight.  Decrease Coumadin dose to 1/2 tablet every day   Current Anticoagulation Instructions: INR 1.9. Take 0.5 tablet daily, except 1 tablet  on Mondays and Fridays. Recheck on Wednesday.

## 2010-08-22 NOTE — Progress Notes (Signed)
Summary: fax request  Phone Note Call from Patient Call back at Home Phone [phone]   Caller: Spouse Call For: clance Summary of Call: pt's spouse- dr Kelli Churn- requests that last ov notes be faxed to dr Rene Paci at [phone] asap. pt has an appt w/ dr Rene Paci tomorrow morning. spouse says that dr clance took pt off of lisinopril but pt has since been put back on this. caller wants dr Rene Paci to be able to know why pt should not be on lisinopril. (pt is currently wheezing "a great deal" ) per spouse. ok to [facility] at callers home # confirming when this has been done  Initial call taken by: Tivis Ringer, CNA,  February 28, 2010 3:47 PM  Follow-up for Phone Call        called and spoke with pt's wife, Dr. Kelli Churn, and informed her Ov note from 09-02-2009 was faxed to Dr. Donette Larry.  This is the OV note where KC stopped pt's Lisinopril and switched pt to Diovan/HCT.   Aundra Millet Reynolds LPN  February 28, 2010 4:04 PM

## 2010-08-22 NOTE — Assessment & Plan Note (Signed)
Summary: f30m/appt time is 2:00pm/hm   Visit Type:  Follow-up Referring Provider:  Dr. Sidney Ace Primary Provider:  Dr. Donette Larry   History of Present Illness: Robert York is seen in followup for cardiac arrest occurring in the setting of profound hypokalemia occurring in the context of chronic alcohol abuse.  Catheterization demonstrated no obstructive disease. There recalls ejection fraction is about 45-50%. There have been no recurrent arrhythmias.  . 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 However, his hospital course was complicated by DVT in his left lower leg requiring Coumadin which then precluded inpatient rehabilitation. He is involved in outpatient rehabilitation.    he is in pursuing AA and outpatient consultation at the [facility].  Current Medications (verified): 1)  Singulair 10 Mg Tabs (Montelukast Sodium) .... As Needed 2)  Xopenex Hfa 45 Mcg/act Aero (Levalbuterol Tartrate) .... 2 Puffs Every 4 To 6 Hours As Needed 3)  Allergy Vaccine 4)  Epipen .... As Directed 5)  Astepro 0.15 % Soln (Azelastine Hcl) .[name].. 1 Spray Two Times A Day As Needed 6)  Fish Oil 1000 Mg Caps (Omega-3 Fatty Acids) .... Once Daily 7)  Vitamin D (Ergocalciferol) 50000 Unit Caps (Ergocalciferol) .... Once Weekly 8)  Felodipine 5 Mg Xr24h-Tab (Felodipine) .... Two Times A Day 9)  Acetaminophen 325 Mg Tabs (Acetaminophen) .... Take 2 Tabs By Mouth Every 4 Hours As Needed 10)  Abilify 5 Mg Tabs (Aripiprazole) .... Take 1/2 Tablet By Mouth Daily 11)  Coreg 6.25 Mg Tabs (Carvedilol) .... Take One Tablet By Mouth Two Times A Day 12)  Warfarin Sodium 5 Mg Tabs (Warfarin Sodium) .... Use As Directed By Anticoagulation Clinic 13)  Lisinopril 10 Mg Tabs (Lisinopril) .... Take One Tablet By Mouth Daily 14)  Mag-Oxide 400 Mg Tabs (Magnesium Oxide) .... Take 1 Tablet By Mouth Daily 15)  Percocet 5-325 Mg Tabs (Oxycodone-Acetaminophen) .... Take 1-2 Tablets By Mouth Every 3  Hours As Needed 16)  Potassium Chloride Crys Cr 20 Meq Cr-Tabs (Potassium Chloride Crys Cr) .... Take 1 Tablet  By Mouth Daily 17)  Aldactone 25 Mg Tabs (Spironolactone) .... Take One Tablet By Mouth Once Daily. 18)  Vitamin B-1 100 Mg Tabs (Thiamine Hcl) .... Take 1 Tablet By Mouth Daily 19)  Omeprazole 20 Mg Cpdr (Omeprazole) .... Take 2 Capsules By Mouth Daily 20)  Benadryl 25 Mg Tabs (Diphenhydramine Hcl) .... As Needed Allergies 21)  Zoloft 50 Mg Tabs (Sertraline Hcl) .... Take One Tablet By Mouth Once Daily. 22)  Ambien 10 Mg Tabs (Zolpidem Tartrate) .... As Needed  Allergies (verified): 1)  Amoxicillin  Past History:  Past Medical History: Last updated: 10/27/2009 HYPERLIPIDEMIA (ICD-272.4) HYPERTENSION (ICD-401.9) ALLERGIC RHINITIS, SEASONAL (ICD-477.0) Ventricular fibrillation leading onto cardiac arrest-2011 Acute respiratory distress Drug withdrawal syndrome and alcohol withdrawal Hypernatremia Propionibacterium bacteremia 1 set of the blood culture on September 29, 2009.  Methicillin-susceptible Staphylococcus aureus pneumonia.  Thrombocytopenia.  Left calf vein deep vein thrombosis and left greater saphenous vein       deep vein thrombosis History of CAD History of chronic obstructive pulmonary disease History of alcohol dependence History of anxiety disorder        Vital Signs:  Patient profile:   60 year old male Height:      71 inches Weight:      221 pounds BMI:     30.93 Pulse rate:   71 / minute BP sitting:   122 / 76  (left  arm)  Vitals Entered By: Laurance Flatten CMA (Nov 24, 2009 1:30 PM)  Physical Exam  General:  The patient was alert and oriented in no acute distress. HEENT Normal.  Neck veins were flat, carotids were brisk.  Lungs were clear.  Heart sounds were regular without murmurs or gallops.  Abdomen was soft with active bowel sounds. There is no clubbing cyanosis or edema. Skin Warm and dry    Impression &  Recommendations:  Problem # 1:  HYPOKALEMIA (ICD-276.8) Recheck his potassium levels today  Problem # 2:  DVT-LEFT LEG (ICD-453.40) we'll check his INR today; I anticipate we will continue Coumadin for 4 more months  Problem # 3:  CARDIAC ARREST (ICD-427.5)  no recurrent arrhythmias His updated medication list for this problem includes:    Felodipine 5 Mg Xr24h-tab (Felodipine) .[name]..[name] Two times a day    Coreg 6.25 Mg Tabs (Carvedilol) .[name]... Take one tablet by mouth two times a day    Warfarin Sodium 5 Mg Tabs (Warfarin sodium) ..... Use as directed by anticoagulation clinic    Lisinopril 10 Mg Tabs (Lisinopril) .[name]... Take one tablet by mouth daily  His updated medication list for this problem includes:    Felodipine 5 Mg Xr24h-tab (Felodipine) .[name]..[name] Two times a day    Coreg 6.25 Mg Tabs (Carvedilol) .[name]... Take one tablet by mouth two times a day    Warfarin Sodium 5 Mg Tabs (Warfarin sodium) ..... Use as directed by anticoagulation clinic    Lisinopril 10 Mg Tabs (Lisinopril) .[name]... Take one tablet by mouth daily  Orders: Echocardiogram (Echo)  Problem # 4:  CARDIOMYOPATHY (ICD-425.4) current on his stable medications. We'll re\re assess his left ventricular function  Problem # 5:  ALCOHOL ABUSE (ICD-305.00) he remains involved in AA and counseling and is staying sboer  Other Orders: TLB-BMP (Basic Metabolic Panel-BMET) (80048-METABOL) TLB-Magnesium (Mg) (83735-MG)  Patient Instructions: 1)  Your physician recommends that you schedule a follow-up appointment in: 5 weeks with Dr Graciela Husbands 2)  Your physician has requested that you have an echocardiogram.  Echocardiography is a painless test that uses sound waves to create images of your heart. It provides your doctor with information about the size and shape of your heart and how well your heart's chambers and valves are working.  This procedure takes approximately one hour. There are no restrictions for this procedure.

## 2010-08-22 NOTE — Assessment & Plan Note (Signed)
Summary: 5wk f/u sl   Referring Provider:  Dr. Sidney Ace Primary Provider:  Dr. Donette Larry   History of Present Illness: [name] is seen in followup for cardiac arrest occurring in the setting of profound hypokalemia occurring in the context of chronic alcohol abuse.  Catheterization demonstrated no obstructive disease. There recalls ejection fraction is about 45-50%. There have been no recurrent arrhythmias.  . 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 However, his hospital course was complicated by DVT in his left lower leg requiring Coumadin which then precluded inpatient rehabilitation. He is involved in outpatient rehabilitation.    he is in pursuing AA and outpatient consultation at the [facility]. and he has remained sober now for about 90 days.  He denies chest pain shortness of breath; there is some edema on his left leg which is its lateral to the DVT. He has been on anticoagulation for about 2 months  His echo cardiogram done last week demonstrates normalization of left ventricular systolic  Current Medications (verified): 1)  Singulair 10 Mg Tabs (Montelukast Sodium) .... As Needed 2)  Xopenex Hfa 45 Mcg/act Aero (Levalbuterol Tartrate) .... 2 Puffs Every 4 To 6 Hours As Needed 3)  Allergy Vaccine 4)  Epipen .... As Directed 5)  Astepro 0.15 % Soln (Azelastine Hcl) .[name].. 1 Spray Two Times A Day As Needed 6)  Fish Oil 1000 Mg Caps (Omega-3 Fatty Acids) .... Once Daily 7)  Vitamin D (Ergocalciferol) 50000 Unit Caps (Ergocalciferol) .... Once Weekly 8)  Felodipine 5 Mg Xr24h-Tab (Felodipine) .... Two Times A Day 9)  Acetaminophen 325 Mg Tabs (Acetaminophen) .... Take 2 Tabs By Mouth Every 4 Hours As Needed 10)  Abilify 5 Mg Tabs (Aripiprazole) .... Take 1/2 Tablet By Mouth Daily 11)  Coreg 6.25 Mg Tabs (Carvedilol) .... Take One Tablet By Mouth Two Times A Day 12)  Warfarin Sodium 5 Mg Tabs (Warfarin Sodium) .... Use As Directed By  Anticoagulation Clinic 13)  Lisinopril 10 Mg Tabs (Lisinopril) .... Take One Tablet By Mouth Daily 14)  Mag-Oxide 400 Mg Tabs (Magnesium Oxide) .... Take 1 Tablet By Mouth Daily 15)  Potassium Chloride Crys Cr 20 Meq Cr-Tabs (Potassium Chloride Crys Cr) .... Take 1 Tablet  By Mouth Daily 16)  Aldactone 25 Mg Tabs (Spironolactone) .... Take One Tablet By Mouth Once Daily. 17)  Vitamin B-1 100 Mg Tabs (Thiamine Hcl) .... Take 1 Tablet By Mouth Daily 18)  Omeprazole 20 Mg Cpdr (Omeprazole) .... Take 2 Capsules By Mouth Daily 19)  Benadryl 25 Mg Tabs (Diphenhydramine Hcl) .... As Needed Allergies 20)  Zoloft 50 Mg Tabs (Sertraline Hcl) .... Take One Tablet By Mouth Once Daily. 21)  Ambien 10 Mg Tabs (Zolpidem Tartrate) .... As Needed  Allergies (verified): 1)  Amoxicillin  Past History:  Past Medical History: Last updated: 10/27/2009 HYPERLIPIDEMIA (ICD-272.4) HYPERTENSION (ICD-401.9) ALLERGIC RHINITIS, SEASONAL (ICD-477.0) Ventricular fibrillation leading onto cardiac arrest-2011 Acute respiratory distress Drug withdrawal syndrome and alcohol withdrawal Hypernatremia Propionibacterium bacteremia 1 set of the blood culture on September 29, 2009.  Methicillin-susceptible Staphylococcus aureus pneumonia.  Thrombocytopenia.  Left calf vein deep vein thrombosis and left greater saphenous vein       deep vein thrombosis History of CAD History of chronic obstructive pulmonary disease History of alcohol dependence History of anxiety disorder        Past Surgical History: Last updated: 09/02/2009 back surgery Tonsillectomy hernia repair  Family History: Last updated: 09/02/2009  allergies-PGF asthma-father heart diesease-PGF breast cancer-mother  Social History: Last updated: 09/02/2009 Patient states former smoker.  Quit May 2007.  2-3ppd x30years alcohol 3-4 glasses/day married 2 children disabled  Vital Signs:  Patient profile:   60 year old male Height:       71 inches Weight:      226 pounds BMI:     31.63 Pulse rate:   69 / minute Pulse rhythm:   regular BP sitting:   108 / 68  (right arm) Cuff size:   large  Vitals Entered By: Robert York CMA (Dec 20, 2009 2:22 PM)  Physical Exam  General:  The patient was alert and oriented in no acute distress. HEENT Normal.  Neck veins were flat, carotids were brisk.  Lungs were clear.  Heart sounds were regular without murmurs or gallops.  Abdomen was soft with active bowel sounds. There is no clubbing cyanosis; trace edema on the left leg Skin Warm and dry affect is engaging    Impression & Recommendations:  Problem # 1:  DVT-LEFT LEG (ICD-453.40) on Coumadin having completed 2 months of therapy out  of  six  Problem # 2:  ABORTED CARDIAC ARREST (ICD-427.41) no recurrent arrhythmias His updated medication list for this problem includes:    Felodipine 5 Mg Xr24h-tab (Felodipine) .[name]..[name] Two times a day    Coreg 6.25 Mg Tabs (Carvedilol) .[name]... Take one tablet by mouth two times a day    Warfarin Sodium 5 Mg Tabs (Warfarin sodium) ..... Use as directed by anticoagulation clinic    Lisinopril 10 Mg Tabs (Lisinopril) .[name]... Take one tablet by mouth daily  Orders: EKG w/ Interpretation (93000)  Problem # 3:  CARDIOMYOPATHY (ICD-425.4) ejection fraction has now normalized.We will continue him on his ACE inhibitor and beta blocker. if his blood pressure remains under control we'll try to wean him off of his calcium blocker The following medications were removed from the medication list:    Aldactone 25 Mg Tabs (Spironolactone) .[name]... Take one tablet by mouth once daily. His updated medication list for this problem includes:    Felodipine 5 Mg Xr24h-tab (Felodipine) .[name]..[name] Two times a day    Coreg 6.25 Mg Tabs (Carvedilol) .[name]... Take one tablet by mouth two times a day    Warfarin Sodium 5 Mg Tabs (Warfarin sodium) ..... Use as directed by anticoagulation clinic    Lisinopril 10 Mg Tabs  (Lisinopril) .[name]... Take one tablet by mouth daily  Problem # 4:  HYPOKALEMIA (ICD-276.8) we will discontinue the spironolactone at this time and follow in 2 weeks and in 4 weeks a metabolic profile to be sure his potassium levels are staying appropriate.  He has no chronic potassium supplementation so we will plan to maintain that for the time being  Problem # 5:  HYPERTENSION (ICD-401.9) as above The following medications were removed from the medication list:    Aldactone 25 Mg Tabs (Spironolactone) .[name]... Take one tablet by mouth once daily. His updated medication list for this problem includes:    Felodipine 5 Mg Xr24h-tab (Felodipine) .[name]..[name] Two times a day    Coreg 6.25 Mg Tabs (Carvedilol) .[name]... Take one tablet by mouth two times a day    Lisinopril 10 Mg Tabs (Lisinopril) .[name]... Take one tablet by mouth daily  Patient Instructions: 1)  Your physician recommends that you return for lab work on 6-9 and 2 weeks after that BMP DX 427.41 2)  Your physician has recommended you make the following change in your  medication: Stop Aldactone 3)  Your physician recommends that you schedule a follow-up appointment in: 3 months with Dr Graciela Husbands.

## 2010-08-22 NOTE — Progress Notes (Signed)
Summary: refill**Please call once sent**  Phone Note Refill Request   Refills Requested: Medication #1:  VITAMIN D (ERGOCALCIFEROL) 50000 UNIT CAPS once weekly   Supply Requested: 3 months  Medication #2:  WARFARIN SODIUM 5 MG TABS Use as directed by Anticoagulation Clinic   Supply Requested: 3 months [facility] [phone]   Method Requested: Telephone to Pharmacy Initial call taken by: Migdalia Dk,  January 06, 2010 4:52 PM  Follow-up for Phone Call        Pt aware to contact PCP for Vitamin D refill. He states they are open on Sat. for a short period of time and he will check on that one tomorrow.  Follow-up by: Bethena Midget, RN, BSN,  January 06, 2010 5:06 PM    Prescriptions: WARFARIN SODIUM 5 MG TABS (WARFARIN SODIUM) Use as directed by Anticoagulation Clinic  #90 x 3   Entered by:   Bethena Midget, RN, BSN   Authorized by:   Nathen May, MD, [facility]   Signed by:   Bethena Midget, RN, BSN on 01/06/2010   Method used:   Electronically to        [facility]* (retail)       [address] [place], [place]  [address]       Ph: [phone] or [phone]       Fax: [phone]   RxID:   [id number]

## 2010-08-22 NOTE — Medication Information (Signed)
Summary: rov/tm  Anticoagulant Therapy  Managed by: Cloyde Reams, RN, BSN Referring MD: Graciela Husbands MD, Viviann Spare PCP: Dr. Jodie Echevaria MD: Myrtis Ser MD, Tinnie Gens Indication 1: DVT Lab Used: [facility] [place] Site: [address] INR POC 1.8 INR RANGE 2.0-3.0  Dietary changes: yes       Details: May have eaten extra vit K.   Health status changes: no    Bleeding/hemorrhagic complications: no    Recent/future hospitalizations: no    Any changes in medication regimen? no    Recent/future dental: no  Any missed doses?: no       Is patient compliant with meds? yes       Allergies: 1)  Amoxicillin  Anticoagulation Management History:      The patient is taking warfarin and comes in today for a routine follow up visit.  Negative risk factors for bleeding include an age less than 76 years old.  The bleeding index is 'low risk'.  Positive CHADS2 values include History of HTN.  Negative CHADS2 values include Age > 71 years old.  Anticoagulation responsible provider: Myrtis Ser MD, Tinnie Gens.  INR POC: 1.8.  Cuvette Lot#: [id number].  Exp: 04/2011.    Anticoagulation Management Assessment/Plan:      The patient's current anticoagulation dose is Warfarin sodium 5 mg tabs: Use as directed by Anticoagulation Clinic.  The target INR is 2.0-3.0.  The next INR is due 02/23/2010.  Anticoagulation instructions were given to patient.  Results were reviewed/authorized by Cloyde Reams, RN, BSN.  He was notified by Cloyde Reams RN.         Prior Anticoagulation Instructions: INR 1.9 Today take 7.5mg s then resume 2.5mg s daily except 5mg s  on Mondays and Thursdays. Recheck in 2 weeks.   Current Anticoagulation Instructions: INR 1.8  Start taking 2.5mg  daily except 5mg  on Mondays, Thursdays, and Saturdays.  Recheck in 2 weeks.

## 2010-08-22 NOTE — Medication Information (Signed)
Summary: [facility]  Anticoagulant Therapy  Managed by: Cloyde Reams, RN, BSN Referring MD: Graciela Husbands MD, Viviann Spare PCP: Dr. Jodie Echevaria MD: Myrtis Ser MD, Tinnie Gens Indication 1: DVT Lab Used: [facility] [place] Site: [address] INR POC 1.7 INR RANGE 2.0-3.0  Dietary changes: no    Health status changes: no    Bleeding/hemorrhagic complications: no    Recent/future hospitalizations: no    Any changes in medication regimen? no    Recent/future dental: no  Any missed doses?: no       Is patient compliant with meds? yes       Allergies: 1)  Amoxicillin  Anticoagulation Management History:      The patient is taking warfarin and comes in today for a routine follow up visit.  Negative risk factors for bleeding include an age less than 89 years old.  The bleeding index is 'low risk'.  Positive CHADS2 values include History of HTN.  Negative CHADS2 values include Age > 57 years old.  Anticoagulation responsible Ahri Olson: Myrtis Ser MD, Tinnie Gens.  INR POC: 1.7.  Cuvette Lot#: [id number].  Exp: 04/2011.    Anticoagulation Management Assessment/Plan:      The patient's current anticoagulation dose is Warfarin sodium 5 mg tabs: Use as directed by Anticoagulation Clinic.  The target INR is 2.0-3.0.  The next INR is due 03/07/2010.  Anticoagulation instructions were given to patient.  Results were reviewed/authorized by Cloyde Reams, RN, BSN.  He was notified by Cloyde Reams RN.         Prior Anticoagulation Instructions: INR 1.8  Start taking 2.5mg  daily except 5mg  on Mondays, Thursdays, and Saturdays.  Recheck in 2 weeks.    Current Anticoagulation Instructions: INR 1.7  Take 5mg  today, then start taking 5mg  daily except 2.5mg  on Sundays, Tuesdays, and Thursdays.  Recheck in 2 weeks.

## 2010-08-22 NOTE — Medication Information (Signed)
Summary: [facility]  Anticoagulant Therapy  Managed by: Inactive Referring MD: Graciela Husbands MD, Viviann Spare PCP: Dr. Jodie Echevaria MD: Juanda Chance MD, Bruce Indication 1: DVT Lab Used: [facility] [place] Site: [address] INR RANGE 2.0-3.0          Comments: Patient was to complete 7 mo of coumadin then discontinue per Dr. Odessa Fleming most recent OV note.  Patient has now discontinued per Graciela Husbands.  Allergies: 1)  ! * Lisinopril 2)  Amoxicillin  Anticoagulation Management History:      Negative risk factors for bleeding include an age less than 32 years old.  The bleeding index is 'low risk'.  Positive CHADS2 values include History of HTN.  Negative CHADS2 values include Age > 58 years old.  Anticoagulation responsible provider: Juanda Chance MD, Smitty Cords.  Exp: 04/2011.    Anticoagulation Management Assessment/Plan:      The target INR is 2.0-3.0.  The next INR is due 04/21/2010.  Anticoagulation instructions were given to patient.  Results were reviewed/authorized by Inactive.         Prior Anticoagulation Instructions: INR 2.3 Continue taking 1/2 tablets on sunday, tuesday, thursday. Take 1 tablet on monday, wednesday, friday, and saturday. See you in 4 weeks.

## 2010-08-22 NOTE — Assessment & Plan Note (Signed)
Summary: rov for cough   Copy to:  Dr. Sidney Ace Primary Provider/Referring Provider:  Dr. Donette Larry  CC:  Pt is here for a 3 week f/u appt.   Pt states cough is 30% improved.  Pt states cough is non-productive at times but will occ cough up light yellow sputum.  Pt c/o Dexilant causing lose bowels.  Pt also states he broke out in a rash this morning on r side of body.  Pt states he has hx of Shingles.  .  History of Present Illness: the pt comes in today for f/u of cough.  At the last visit, he was taken off his ace inhibitor, was treated for reflux, and was changed from advair to qvar to limit irritation to the upper airway.  The pt comes in today where his cough is at least 30% improved.  He did have some gi upset from dexilant.  He has noted no change in his breathing since being off advair.  Current Medications (verified): 1)  Omnaris 50 Mcg/act Susp (Ciclesonide) .... 2 Sprays in Each Nostril Each Morning 2)  Patanase 0.6 % Soln (Olopatadine Hcl) .... 2 Sprays in Each Nostril Two Times A Day 3)  Singulair 10 Mg Tabs (Montelukast Sodium) .... Take 1 Tablet By Mouth Once A Day 4)  Xyzal 5 Mg Tabs (Levocetirizine Dihydrochloride) .... Take 1 Tab By Mouth At Bedtime 5)  Xopenex Hfa 45 Mcg/act Aero (Levalbuterol Tartrate) .... 2 Puffs Every 4 To 6 Hours As Needed 6)  Allergy Vaccine 7)  Epipen .... As Directed 8)  Astepro 0.15 % Soln (Azelastine Hcl) .[name].. 1 Spray Two Times A Day 9)  Potassium Gluconate 595 Mg Cr-Tabs (Potassium Gluconate) .... Once Daily 10)  Fish Oil 1000 Mg Caps (Omega-3 Fatty Acids) .... Once Daily 11)  Vitamin D (Ergocalciferol) 50000 Unit Caps (Ergocalciferol) .... Once Weekly 12)  Felodipine 5 Mg Xr24h-Tab (Felodipine) .... Two Times A Day 13)  Diovan Hct 320-25 Mg Tabs (Valsartan-Hydrochlorothiazide) .... Take 1 Tablet By Mouth Once A Day 14)  Zolpidem Tartrate 10 Mg Tabs (Zolpidem Tartrate) .... As Needed 15)  Ativan 0.5 Mg Tabs (Lorazepam) .... As Needed 16)   Flexeril 5 Mg Tabs (Cyclobenzaprine Hcl) .... At Bedtime As Needed 17)  Chlorpheniramine Maleate 4 Mg Tabs (Chlorpheniramine Maleate) .... As Needed 18)  Ibuprofen 400 Mg Tabs (Ibuprofen) .... As Needed 19)  Qvar 80 Mcg/act Aers (Beclomethasone Dipropionate) .... Inhale 2 Puffs Two Times A Day 20)  Dexilant 60 Mg Cpdr (Dexlansoprazole) .... Take 1 Tablet By Mouth Once A Day  Allergies (verified): 1)  Amoxicillin  Review of Systems      See HPI  Vital Signs:  Patient profile:   60 year old male Height:      71 inches Weight:      235.13 pounds BMI:     32.91 O2 Sat:      97 % on Room air Temp:     97.5 degrees F oral Pulse rate:   84 / minute BP sitting:   138 / 70  (left arm) Cuff size:   large  Vitals Entered By: Arman Filter LPN (September 23, 1608 9:59 AM)  O2 Flow:  Room air CC: Pt is here for a 3 week f/u appt.   Pt states cough is 30% improved.  Pt states cough is non-productive at times but will occ cough up light yellow sputum.  Pt c/o Dexilant causing lose bowels.  Pt also states he broke out in  a rash this morning on r side of body.  Pt states he has hx of Shingles.   Comments Medications reviewed with patient Arman Filter LPN  September 23, 1608 9:59 AM    Physical Exam  General:  ow male in nad Nose:  no purulence noted Lungs:  totally clear to auscultation Heart:  rrr Extremities:  no cyanosis noted Neurologic:  alert and oriented, moves all 4.   Impression & Recommendations:  Problem # 1:  COUGH (ICD-786.2) I continue to believe this is primarily upper airway in origin.  It may take up to 8wks for the ACE to get out of his system, and we should continue with aggressive treatment of reflux.  Will change dexilant to two times a day omeprazole.  Will also d/c qvar and check spirometry next visit, since it is unclear if he has asthma  Medications Added to Medication List This Visit: 1)  Diovan Hct 320-25 Mg Tabs (Valsartan-hydrochlorothiazide) .... Take 1 tablet  by mouth once a day 2)  Flexeril 5 Mg Tabs (Cyclobenzaprine hcl) .... At bedtime as needed 3)  Qvar 80 Mcg/act Aers (Beclomethasone dipropionate) .... Inhale 2 puffs two times a day 4)  Dexilant 60 Mg Cpdr (Dexlansoprazole) .... Take 1 tablet by mouth once a day  Other Orders: Est. Patient Level III (96045)  Patient Instructions: 1)  stop dexilant..Start back on omeprazole 20mg  one in am and pm. 2)  stop qvar.  Will check breathing tests next visit. 3)  can use proair for rescue, but call if breathing is worsening. 4)  stay on diovan 5)  followup with me in 4 weeks.

## 2010-08-22 NOTE — Assessment & Plan Note (Signed)
Summary: consult for cough   Copy to:  Dr. Sidney Ace Primary Provider/Referring Provider:  Dr. Donette Larry  CC:  Pulmonary Consult.  [name]  History of Present Illness: The pt is a 60y/o male who I have been asked to see for chronic cough.  He has a h/o "asthma" in the past, and is being followed by Dr Uvalde Callas for significant allergic disease with chronic rhinitis.  He is being maintained on singulair, an aggressive nasal hygeine regimen, and also allergy vaccine.  Spirometry results from Dr. Lyla Son office dating back to 11/2008 are all normal.  Cxr from 06/2009 is also clear with no acute process.  The pt c/o a chronic cough starting in Jan of this year, with white and yellow mucus production.  He was treated with a zpak and prednisone, and evaluated by Dr. Ezzard Standing of ENT with nothing being found per pt.  His cough has subsequently improved, but not resolved, and he currently rates a 2 out of 10 in terms of severity.  He describes this as a tickle in his throat, and he admits to constant throat clearing.  He also has reflux symptoms, and takes omeprazole as needed.  He has done better on his allergy regiment wrt nasal symptoms, but still has postnasal drip at times.  He has had breathing issues at times over the last 5 years that are not progressive, and he describes "an anxiety feeling" in his chest.  Preventive Screening-Counseling & Management  Alcohol-Tobacco     Smoking Status: quit  Current Medications (verified): 1)  Omnaris 50 Mcg/act Susp (Ciclesonide) .... 2 Sprays in Each Nostril Each Morning 2)  Patanase 0.6 % Soln (Olopatadine Hcl) .... 2 Sprays in Each Nostril Two Times A Day 3)  Singulair 10 Mg Tabs (Montelukast Sodium) .... Take 1 Tablet By Mouth Once A Day 4)  Xyzal 5 Mg Tabs (Levocetirizine Dihydrochloride) .... Take 1 Tab By Mouth At Bedtime 5)  Xopenex Hfa 45 Mcg/act Aero (Levalbuterol Tartrate) .... 2 Puffs Every 4 To 6 Hours As Needed 6)  Allergy Vaccine 7)  Epipen .... As  Directed 8)  Astepro 0.15 % Soln (Azelastine Hcl) .[name].. 1 Spray Two Times A Day 9)  Spiriva Handihaler 18 Mcg Caps (Tiotropium Bromide Monohydrate) .... Once Daily 10)  Advair Diskus 500-50 Mcg/dose Aepb (Fluticasone-Salmeterol) .[name].. 1 Puff Two Times A Day 11)  Omeprazole 20 Mg Cpdr (Omeprazole) .... 2 Before Meal 12)  Potassium Gluconate 595 Mg Cr-Tabs (Potassium Gluconate) .... Once Daily 13)  Fish Oil 1000 Mg Caps (Omega-3 Fatty Acids) .... Once Daily 14)  Vitamin D (Ergocalciferol) 50000 Unit Caps (Ergocalciferol) .... Once Weekly 15)  Felodipine 5 Mg Xr24h-Tab (Felodipine) .... Two Times A Day 16)  Lisinopril-Hydrochlorothiazide 20-25 Mg Tabs (Lisinopril-Hydrochlorothiazide) .... Once Daily 17)  Zolpidem Tartrate 10 Mg Tabs (Zolpidem Tartrate) .... As Needed 18)  Ativan 0.5 Mg Tabs (Lorazepam) .... As Needed 19)  Flexeril 5 Mg Tabs (Cyclobenzaprine Hcl) .... At Bedtime 20)  Centrum Silver  Tabs (Multiple Vitamins-Minerals) .... Once Daily 21)  Chlorpheniramine Maleate 4 Mg Tabs (Chlorpheniramine Maleate) .... As Needed 22)  Ibuprofen 400 Mg Tabs (Ibuprofen) .... As Needed  Allergies (verified): 1)  Amoxicillin  Past History:  Past Medical History: HYPERLIPIDEMIA (ICD-272.4) HYPERTENSION (ICD-401.9) ALLERGIC RHINITIS, SEASONAL (ICD-477.0)  Past Surgical History: back surgery Tonsillectomy hernia repair  Family History: Reviewed history and no changes required. allergies-PGF asthma-father heart diesease-PGF breast cancer-mother  Social History: Reviewed history and no changes required. Patient states former smoker.  Quit May  2007.  2-3ppd x30years alcohol 3-4 glasses/day married 2 children disabled  Smoking Status:  quit  Review of Systems       The patient complains of acid heartburn, sore throat, tooth/dental problems, and hand/feet swelling.  The patient denies shortness of breath with activity, shortness of breath at rest, productive cough, non-productive  cough, coughing up blood, chest pain, irregular heartbeats, indigestion, loss of appetite, weight change, abdominal pain, difficulty swallowing, headaches, nasal congestion/difficulty breathing through nose, sneezing, itching, ear ache, anxiety, depression, joint stiffness or pain, rash, change in color of mucus, and fever.    Vital Signs:  Patient profile:   60 year old male Height:      71 inches Weight:      237.50 pounds BMI:     33.24 O2 Sat:      97 % on Room air Temp:     97.9 degrees F oral Pulse rate:   52 / minute BP sitting:   110 / 84  (right arm) Cuff size:   regular  Vitals Entered By: Gweneth Dimitri RN (September 02, 2009 1:50 PM)  O2 Flow:  Room air CC: Pulmonary Consult.   Comments Medications reviewed with patient Daytime contact number verified with patient. Gweneth Dimitri RN  September 02, 2009 1:58 PM    Physical Exam  General:  68 male in nad Eyes:  PERRLA and EOMI.   Nose:  crusted with some mild blood no purulence Mouth:  clear Neck:  no jvd, tmg, LN Lungs:  totally clear to auscultation Heart:  irregular rhythm, no mrg Abdomen:  soft and nontender, bs+ Extremities:  mild edema LE, no cyanosis pulses intact distally Neurologic:  alert and oriented, moves all 4.   Impression & Recommendations:  Problem # 1:  COUGH (ICD-786.2)  the pt's cough is clearly upper airway in origin, and I suspect is coming from his ACE inhibitor, postnasal drip, and possibly reflux disease.  It is absolutely essential that he get off the ACE inhibitor, and should continue working with Dr. [place] Callas on his allergy/rhinitis issues.  It is unclear if he even has asthma at this point, with all of his spirometry reading being normal (but on meds).  He does not have COPD.  I have asked him to stop spiriva and advair which can irritate the upper airway and contribute to cough, and to start qvar for presumed asthma.  We will sort out whether he has asthma or not at a later date.  I also  think we should treat GERD more aggessively since this may be contributing to his cough.  Problem # 2:  CHRONIC RHINITIS (ICD-472.0)  Per Dr. Dushore Callas.  Medications Added to Medication List This Visit: 1)  Astepro 0.15 % Soln (Azelastine hcl) .[name].. 1 spray two times a day 2)  Spiriva Handihaler 18 Mcg Caps (Tiotropium bromide monohydrate) .... Once daily 3)  Advair Diskus 500-50 Mcg/dose Aepb (Fluticasone-salmeterol) .[name].. 1 puff two times a day 4)  Omeprazole 20 Mg Cpdr (Omeprazole) .... 2 before meal 5)  Potassium Gluconate 595 Mg Cr-tabs (Potassium gluconate) .... Once daily 6)  Fish Oil 1000 Mg Caps (Omega-3 fatty acids) .... Once daily 7)  Vitamin D (ergocalciferol) 50000 Unit Caps (Ergocalciferol) .... Once weekly 8)  Felodipine 5 Mg Xr24h-tab (Felodipine) .... Two times a day 9)  Lisinopril-hydrochlorothiazide 20-25 Mg Tabs (Lisinopril-hydrochlorothiazide) .... Once daily 10)  Zolpidem Tartrate 10 Mg Tabs (Zolpidem tartrate) .... As needed 11)  Ativan 0.5 Mg Tabs (Lorazepam) .... As needed 12)  Flexeril 5 Mg Tabs (Cyclobenzaprine hcl) .... At bedtime 13)  Centrum Silver Tabs (Multiple vitamins-minerals) .... Once daily 14)  Chlorpheniramine Maleate 4 Mg Tabs (Chlorpheniramine maleate) .... As needed 15)  Ibuprofen 400 Mg Tabs (Ibuprofen) .... As needed  Other Orders: Consultation Level IV (16109)  Patient Instructions: 1)  stop lisinopril. 2)  start diovan/hctz 320/25mg  one each day.  Please call Dr. Paulita Fujita nurse and let her know of change. 3)  stop spiriva, advair 4)  start qvar  2 inhalations am and pm  each day...rinse mouth well. 5)  can use xopenex inhaler for rescue/emergencies only 6)  stop omeprazole for now, and start dexilant 60mg  at bedtime everynight 7)  followup with me in 3 weeks 8)  bring xrays from Dr Paulita Fujita office to next visit.   Immunization History:  Influenza Immunization History:    Influenza:  historical (04/22/2009)

## 2010-08-22 NOTE — Medication Information (Signed)
Summary: rov/ewj  Anticoagulant Therapy  Managed by: Weston Brass, PharmD Referring MD: Graciela Husbands MD, Viviann Spare PCP: Dr. Jodie Echevaria MD: Juanda Chance MD, Bruce Indication 1: DVT Lab Used: [facility] [place] Site: [address] INR POC 2.2 INR RANGE 2.0-3.0  Dietary changes: no    Health status changes: no    Bleeding/hemorrhagic complications: no    Recent/future hospitalizations: no    Any changes in medication regimen? yes       Details: D/C lisinopril due to cough/congestion and began taking Diovan/HCTZ last week per Dr. Eula Listen.    Recent/future dental: no  Any missed doses?: no       Is patient compliant with meds? yes       Allergies: 1)  Amoxicillin  Anticoagulation Management History:      The patient is taking warfarin and comes in today for a routine follow up visit.  Negative risk factors for bleeding include an age less than 64 years old.  The bleeding index is 'low risk'.  Positive CHADS2 values include History of HTN.  Negative CHADS2 values include Age > 78 years old.  Anticoagulation responsible provider: Juanda Chance MD, Smitty Cords.  INR POC: 2.2.  Cuvette Lot#: [id number].  Exp: 04/2011.    Anticoagulation Management Assessment/Plan:      The patient's current anticoagulation dose is Warfarin sodium 5 mg tabs: Use as directed by Anticoagulation Clinic.  The target INR is 2.0-3.0.  The next INR is due 03/21/2010.  Anticoagulation instructions were given to patient.  Results were reviewed/authorized by Weston Brass, PharmD.  He was notified by Gweneth Fritter, PharmD Candidate.         Prior Anticoagulation Instructions: INR 1.7  Take 5mg  today, then start taking 5mg  daily except 2.5mg  on Sundays, Tuesdays, and Thursdays.  Recheck in 2 weeks.    Current Anticoagulation Instructions: INR 2.2  Continue taking 1 tablet (5mg ) every day except take 1/2 tablet (2.5mg ) on Sundays, Tuesdays, and Thursdays.  Recheck in 2 weeks.

## 2010-08-22 NOTE — Assessment & Plan Note (Signed)
Summary: nph    Referring Provider:  Dr. Sidney Ace Primary Provider:  Dr. Donette Larry   History of Present Illness: Robert York is seen in followup for cardiac arrest occurring in the setting of profound hypokalemia occurring in the context of chronic alcohol abuse.  Catheterization demonstrated no obstructive disease. There recalls ejection fraction is about 45-50%. There have been no recurrent arrhythmias.  . 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  the plan had been for him to go to inpatient rehabilitation. However, his hospital course was complicated by DVT in his left lower leg requiring Coumadin which then precluded inpatient rehabilitation. He is involved in outpatient rehabilitation.  He is struggling with sleep.  Current Medications (verified): 1)  Singulair 10 Mg Tabs (Montelukast Sodium) .... Take 1 Tablet By Mouth Once A Day 2)  Xopenex Hfa 45 Mcg/act Aero (Levalbuterol Tartrate) .... 2 Puffs Every 4 To 6 Hours As Needed 3)  Allergy Vaccine 4)  Epipen .... As Directed 5)  Astepro 0.15 % Soln (Azelastine Hcl) .[name].. 1 Spray Two Times A Day As Needed 6)  Fish Oil 1000 Mg Caps (Omega-3 Fatty Acids) .... Once Daily 7)  Vitamin D (Ergocalciferol) 50000 Unit Caps (Ergocalciferol) .... Once Weekly 8)  Felodipine 5 Mg Xr24h-Tab (Felodipine) .... Two Times A Day 9)  Acetaminophen 325 Mg Tabs (Acetaminophen) .... Take 2 Tabs By Mouth Every 4 Hours As Needed 10)  Abilify 5 Mg Tabs (Aripiprazole) .... Take 1 Tablet By Mouth Daily 11)  Aspirin Ec 325 Mg Tbec (Aspirin) .... Take One Tablet By Mouth Daily 12)  Carvedilol 6.25 Mg Tabs (Carvedilol) .... Take One Tablet By Mouth Twice A Day 13)  Warfarin Sodium 5 Mg Tabs (Warfarin Sodium) .... Use As Directed By Anticoagulation Clinic 14)  Ensure  Liqd (Nutritional Supplements) .... Drink 1 Can Every 8 Hours 15)  Lisinopril 10 Mg Tabs (Lisinopril) .... Take One Tablet By Mouth Daily 16)  Mag-Oxide 400 Mg Tabs  (Magnesium Oxide) .... Take 1 Tablet By Mouth Daily 17)  Percocet 5-325 Mg Tabs (Oxycodone-Acetaminophen) .... Take 1-2 Tablets By Mouth Every 3 Hours As Needed 18)  Potassium Chloride Crys Cr 20 Meq Cr-Tabs (Potassium Chloride Crys Cr) .... Take Two Tablets By Mouth Daily 19)  Spironolactone 25 Mg Tabs (Spironolactone) .... Take One Tablet By Mouth Daily 20)  Vitamin B-1 100 Mg Tabs (Thiamine Hcl) .... Take 1 Tablet By Mouth Daily 21)  Advair Diskus 500-50 Mcg/dose Aepb (Fluticasone-Salmeterol) .... Inhale 1 Puff By Mouth Daily As Needed 22)  Bentyl 10 Mg Caps (Dicyclomine Hcl) .... Take 1 Tablet By Mouth At Bedtime As Needed 23)  Omeprazole 20 Mg Cpdr (Omeprazole) .... Take 2 Capsules By Mouth Daily 24)  Spiriva Handihaler 18 Mcg Caps (Tiotropium Bromide Monohydrate) .... Inhale The Contents of 1 Capsule Daily  Allergies (verified): 1)  Amoxicillin  Past History:  Past Medical History: Last updated: 10/27/2009 HYPERLIPIDEMIA (ICD-272.4) HYPERTENSION (ICD-401.9) ALLERGIC RHINITIS, SEASONAL (ICD-477.0) Ventricular fibrillation leading onto cardiac arrest-2011 Acute respiratory distress Drug withdrawal syndrome and alcohol withdrawal Hypernatremia Propionibacterium bacteremia 1 set of the blood culture on September 29, 2009.  Methicillin-susceptible Staphylococcus aureus pneumonia.  Thrombocytopenia.  Left calf vein deep vein thrombosis and left greater saphenous vein       deep vein thrombosis History of CAD History of chronic obstructive pulmonary disease History of alcohol dependence History of anxiety disorder        Past Surgical History: Last updated: 09/02/2009  back surgery Tonsillectomy hernia repair  Family History: Last updated: 09/02/2009 allergies-PGF asthma-father heart diesease-PGF breast cancer-mother  Social History: Last updated: 09/02/2009 Patient states former smoker.  Quit May 2007.  2-3ppd x30years alcohol 3-4 glasses/day married 2  children disabled  Vital Signs:  Patient profile:   60 year old male Height:      71 inches Weight:      211 pounds BMI:     29.53 Pulse rate:   68 / minute Pulse rhythm:   regular BP sitting:   109 / 68  (left arm) Cuff size:   large  Vitals Entered By: Judithe Modest CMA (October 28, 2009 11:33 AM)  Physical Exam  General:  The patient was alert and oriented in no acute distress. HEENT Normal.  Neck veins were flat, carotids were brisk.  Lungs were clear.  Heart sounds were regular without murmurs or gallops.  Abdomen was soft with active bowel sounds. There is no clubbing cyanosis or edema. Skin Warm and dry    Impression & Recommendations:  Problem # 1:  ABORTED CARDIAC ARREST (ICD-427.41)  cardiac arrest occurred in the context of profound hypokalemia. He is on potassium replacement. We'll measure his K  and magnesium levels today.  At this point he needs to continue I think to refrain from driving. He does cystoscopy notwithstanding the fact that the likelihood of recurrence is low. He has ongoing issues with his encephalopathy and short-term memory loss. Hopefully this will resolve over the next little bit time. The following medications were removed from the medication list:    Aspirin Ec 325 Mg Tbec (Aspirin) .[name]... Take one tablet by mouth daily His updated medication list for this problem includes:    Felodipine 5 Mg Xr24h-tab (Felodipine) .[name]..[name] Two times a day    Carvedilol 6.25 Mg Tabs (Carvedilol) .[name]... Take one tablet by mouth twice a day    Warfarin Sodium 5 Mg Tabs (Warfarin sodium) ..... Use as directed by anticoagulation clinic    Lisinopril 10 Mg Tabs (Lisinopril) .[name]... Take one tablet by mouth daily  His updated medication list for this problem includes:    Felodipine 5 Mg Xr24h-tab (Felodipine) .[name]..[name] Two times a day    Aspirin Ec 325 Mg Tbec (Aspirin) .[name]... Take one tablet by mouth daily    Carvedilol 6.25 Mg Tabs (Carvedilol) .[name]... Take one tablet by  mouth twice a day    Warfarin Sodium 5 Mg Tabs (Warfarin sodium) ..... Use as directed by anticoagulation clinic    Lisinopril 10 Mg Tabs (Lisinopril) .[name]... Take one tablet by mouth daily  Problem # 2:  ALCOHOL ABUSE (ICD-305.00) the patient is involved in outpatient rehabilitation. There issues about sleep and need for sleep medications. He'll be important for him to establish contact with psychiatry followup to help manage his substance abuse.  Problem # 3:  HYPOKALEMIA, HX OF (ICD-V12.2) check his potassium as noted above.  Problem # 4:  CARDIOMYOPATHY (ICD-425.4) we'll continue him on his current medications. Is probably appropriate at this point to discontinue aspirin as long as he is on Coumadin per The following medications were removed from the medication list:    Aspirin Ec 325 Mg Tbec (Aspirin) .[name]... Take one tablet by mouth daily His updated medication list for this problem includes:    Felodipine 5 Mg Xr24h-tab (Felodipine) .[name]..[name] Two times a day    Carvedilol 6.25 Mg Tabs (Carvedilol) .[name]... Take one tablet by mouth twice a day    Warfarin Sodium 5  Mg Tabs (Warfarin sodium) ..... Use as directed by anticoagulation clinic    Lisinopril 10 Mg Tabs (Lisinopril) .[name]... Take one tablet by mouth daily    Spironolactone 25 Mg Tabs (Spironolactone) .[name]... Take one tablet by mouth daily  Orders: TLB-BMP (Basic Metabolic Panel-BMET) (80048-METABOL) TLB-Magnesium (Mg) (83735-MG)  Problem # 5:  DVT-LEFT LEG (ICD-453.40) he will need to be maintained on Coumadin..  Having reviewed the issue briefly and up-to-date, a number of factors stand out.  First as a provoked event 3-6 months of therapy is appropriate. As a provoked event the risk of recurrence is lower in man than in women. third there is some data that low dose ongoing therapy may be beneficial i.e. INR is 1.5-2.0 and this might be appropriate for months 3-6  Other Orders: EKG w/ Interpretation (93000)  Patient Instructions: 1)   Your physician recommends that you schedule a follow-up appointment in: 4 weeks 2)  Your physician recommends that you have lab work today: bmet/magnesium (425.4;427.5;276.8) 3)  Your physician has recommended you make the following change in your medication: 1) STOP aspirin.

## 2010-08-22 NOTE — Progress Notes (Signed)
Summary: test result  Phone Note Call from Patient Call back at Home Phone [phone]   Caller: Patient Reason for Call: Talk to Nurse, Lab or Test Results Initial call taken by: Lorne Skeens,  April 28, 2010 4:07 PM  Follow-up for Phone Call        adv pt of lab results. he also adv he restarted Diovan a week ago which would explain why K+ has dropped a bit since he has been taking lasix as well.  Follow-up by: Claris Gladden RN,  April 28, 2010 4:40 PM

## 2010-08-22 NOTE — Medication Information (Signed)
Summary: rov/ewj  Anticoagulant Therapy  Managed by: Cloyde Reams, RN, BSN Referring MD: Graciela Husbands MD, Viviann Spare PCP: Dr. Jodie Echevaria MD: Excell Seltzer MD, Casimiro Needle Indication 1: DVT Lab Used: [facility] [place] Site: [address] INR POC 2.1 INR RANGE 2.0-3.0  Dietary changes: yes       Details: Incr appetite, and weight gain.  Health status changes: no    Bleeding/hemorrhagic complications: no    Recent/future hospitalizations: no    Any changes in medication regimen? yes       Details: Tylenol prn for pain  Recent/future dental: no  Any missed doses?: no       Is patient compliant with meds? yes      Comments: Resumed allergy shots.    Current Medications (verified): 1)  Singulair 10 Mg Tabs (Montelukast Sodium) .... Take 1 Tablet By Mouth Once A Day 2)  Xopenex Hfa 45 Mcg/act Aero (Levalbuterol Tartrate) .... 2 Puffs Every 4 To 6 Hours As Needed 3)  Allergy Vaccine 4)  Epipen .... As Directed 5)  Astepro 0.15 % Soln (Azelastine Hcl) .[name].. 1 Spray Two Times A Day As Needed 6)  Fish Oil 1000 Mg Caps (Omega-3 Fatty Acids) .... Once Daily 7)  Vitamin D (Ergocalciferol) 50000 Unit Caps (Ergocalciferol) .... Once Weekly 8)  Felodipine 5 Mg Xr24h-Tab (Felodipine) .... Two Times A Day 9)  Acetaminophen 325 Mg Tabs (Acetaminophen) .... Take 2 Tabs By Mouth Every 4 Hours As Needed 10)  Abilify 5 Mg Tabs (Aripiprazole) .... Take 1 Tablet By Mouth Daily 11)  Carvedilol 6.25 Mg Tabs (Carvedilol) .... Take One Tablet By Mouth Twice A Day 12)  Warfarin Sodium 5 Mg Tabs (Warfarin Sodium) .... Use As Directed By Anticoagulation Clinic 13)  Ensure  Liqd (Nutritional Supplements) .... Drink 1 Can Every 8 Hours 14)  Lisinopril 10 Mg Tabs (Lisinopril) .... Take One Tablet By Mouth Daily 15)  Mag-Oxide 400 Mg Tabs (Magnesium Oxide) .... Take 1 Tablet By Mouth Daily 16)  Percocet 5-325 Mg Tabs (Oxycodone-Acetaminophen) .... Take 1-2 Tablets By Mouth Every 3 Hours As Needed 17)   Potassium Chloride Crys Cr 20 Meq Cr-Tabs (Potassium Chloride Crys Cr) .... Take Two Tablets By Mouth Daily 18)  Spironolactone 25 Mg Tabs (Spironolactone) .... Take One Tablet By Mouth Daily 19)  Vitamin B-1 100 Mg Tabs (Thiamine Hcl) .... Take 1 Tablet By Mouth Daily 20)  Bentyl 10 Mg Caps (Dicyclomine Hcl) .... Take 1 Tablet By Mouth At Bedtime As Needed 21)  Omeprazole 20 Mg Cpdr (Omeprazole) .... Take 2 Capsules By Mouth Daily 22)  Benadryl 25 Mg Tabs (Diphenhydramine Hcl) .... As Needed Allergies  Allergies (verified): 1)  Amoxicillin  Anticoagulation Management History:      The patient is taking warfarin and comes in today for a routine follow up visit.  Negative risk factors for bleeding include an age less than 39 years old.  The bleeding index is 'low risk'.  Positive CHADS2 values include History of HTN.  Negative CHADS2 values include Age > 70 years old.  Anticoagulation responsible provider: Excell Seltzer MD, Casimiro Needle.  INR POC: 2.1.  Cuvette Lot#: [id number].  Exp: 12/2010.    Anticoagulation Management Assessment/Plan:      The patient's current anticoagulation dose is Warfarin sodium 5 mg tabs: Use as directed by Anticoagulation Clinic.  The target INR is 2.0-3.0.  The next INR is due 11/15/2009.  Anticoagulation instructions were given to patient.  Results were reviewed/authorized by Cloyde Reams, RN, BSN.  He  was notified by Cloyde Reams RN.         Prior Anticoagulation Instructions: INR 3.1  Start taking 1/2 tablet daily except 1 tablet on Mondays.  Recheck in 1 week.    Current Anticoagulation Instructions: INR 2.1  Continue on same dosage 1/2 tablet daily except 1 tablet on Mondays.  Recheck in 1 week.

## 2010-08-22 NOTE — Medication Information (Signed)
Summary: rov/cb  Anticoagulant Therapy  Managed by: Bethena Midget, RN, BSN Referring MD: Graciela Husbands MD, Viviann Spare PCP: Dr. Jodie Echevaria MD: Ladona Ridgel MD, Sharlot Gowda Indication 1: DVT Lab Used: [facility] [place] Site: [address] INR POC 1.9 INR RANGE 2.0-3.0  Dietary changes: yes       Details: Less leafy green veggies  Health status changes: no    Bleeding/hemorrhagic complications: no    Recent/future hospitalizations: no    Any changes in medication regimen? no    Recent/future dental: no  Any missed doses?: no       Is patient compliant with meds? yes       Allergies: 1)  Amoxicillin  Anticoagulation Management History:      The patient is taking warfarin and comes in today for a routine follow up visit.  Negative risk factors for bleeding include an age less than 68 years old.  The bleeding index is 'low risk'.  Positive CHADS2 values include History of HTN.  Negative CHADS2 values include Age > 31 years old.  Anticoagulation responsible provider: Ladona Ridgel MD, Sharlot Gowda.  INR POC: 1.9.  Cuvette Lot#: [id number].  Exp: 03/2011.    Anticoagulation Management Assessment/Plan:      The patient's current anticoagulation dose is Warfarin sodium 5 mg tabs: Use as directed by Anticoagulation Clinic.  The target INR is 2.0-3.0.  The next INR is due 02/09/2010.  Anticoagulation instructions were given to patient.  Results were reviewed/authorized by Bethena Midget, RN, BSN.  He was notified by Bethena Midget, RN, BSN.         Prior Anticoagulation Instructions: INR 2.4. Take 0.5 tablet daily except 1 tablet on Monday and Thursday. Recheck in 4 weeks.  Current Anticoagulation Instructions: INR 1.9 Today take 7.5mg s then resume 2.5mg s daily except 5mg s  on Mondays and Thursdays. Recheck in 2 weeks.

## 2010-08-22 NOTE — Assessment & Plan Note (Signed)
Summary: Med list update

## 2010-08-24 ENCOUNTER — Telehealth (INDEPENDENT_AMBULATORY_CARE_PROVIDER_SITE_OTHER): Payer: Self-pay | Admitting: *Deleted

## 2010-09-13 NOTE — Progress Notes (Signed)
  Phone Note Outgoing Call   Call placed by: Scherrie Bateman, LPN,  August 24, 2010 11:38 AM Call placed to: Patient Summary of Call: LMTCB. NEEDS REPEAT BMET ./CY Initial call taken by: Scherrie Bateman, LPN,  August 24, 2010 11:39 AM  Follow-up for Phone Call        [facility] Scherrie Bateman, LPN  August 28, 2010 1:32 PM  PT AWARE OF THE NEED TO REPEAT BMET WILL DO AT NEXT OFFICE VISIT SEEING DR Graciela Husbands [facility] 2012.PER PT STARTED POTASSIUM SUPPLEMENT two times a day  Follow-up by: Scherrie Bateman, LPN,  September 05, 2010 3:25 PM

## 2010-10-09 ENCOUNTER — Ambulatory Visit: Payer: Self-pay | Admitting: Internal Medicine

## 2010-10-09 ENCOUNTER — Other Ambulatory Visit: Payer: Self-pay

## 2010-10-11 LAB — GLUCOSE, CAPILLARY: Glucose-Capillary: 101 mg/dL — ABNORMAL HIGH (ref 70–99)

## 2010-10-11 LAB — PROTIME-INR: Prothrombin Time: 14.4 seconds (ref 11.6–15.2)

## 2010-10-16 LAB — CBC
HCT: 32.9 % — ABNORMAL LOW (ref 39.0–52.0)
HCT: 34.1 % — ABNORMAL LOW (ref 39.0–52.0)
HCT: 34.5 % — ABNORMAL LOW (ref 39.0–52.0)
HCT: 36.5 % — ABNORMAL LOW (ref 39.0–52.0)
HCT: 37.7 % — ABNORMAL LOW (ref 39.0–52.0)
HCT: 41.9 % (ref 39.0–52.0)
HCT: 45.2 % (ref 39.0–52.0)
HCT: 45.6 % (ref 39.0–52.0)
HCT: 45.8 % (ref 39.0–52.0)
HCT: 47 % (ref 39.0–52.0)
HCT: 49.8 % (ref 39.0–52.0)
Hemoglobin: 11.2 g/dL — ABNORMAL LOW (ref 13.0–17.0)
Hemoglobin: 11.3 g/dL — ABNORMAL LOW (ref 13.0–17.0)
Hemoglobin: 11.4 g/dL — ABNORMAL LOW (ref 13.0–17.0)
Hemoglobin: 11.6 g/dL — ABNORMAL LOW (ref 13.0–17.0)
Hemoglobin: 12.2 g/dL — ABNORMAL LOW (ref 13.0–17.0)
Hemoglobin: 12.7 g/dL — ABNORMAL LOW (ref 13.0–17.0)
Hemoglobin: 14.7 g/dL (ref 13.0–17.0)
Hemoglobin: 15.7 g/dL (ref 13.0–17.0)
Hemoglobin: 16 g/dL (ref 13.0–17.0)
MCHC: 33.8 g/dL (ref 30.0–36.0)
MCHC: 34 g/dL (ref 30.0–36.0)
MCHC: 34.2 g/dL (ref 30.0–36.0)
MCHC: 34.3 g/dL (ref 30.0–36.0)
MCHC: 34.5 g/dL (ref 30.0–36.0)
MCHC: 34.5 g/dL (ref 30.0–36.0)
MCHC: 34.7 g/dL (ref 30.0–36.0)
MCHC: 34.8 g/dL (ref 30.0–36.0)
MCHC: 34.8 g/dL (ref 30.0–36.0)
MCHC: 34.9 g/dL (ref 30.0–36.0)
MCHC: 35 g/dL (ref 30.0–36.0)
MCHC: 35.1 g/dL (ref 30.0–36.0)
MCV: 95 fL (ref 78.0–100.0)
MCV: 95.5 fL (ref 78.0–100.0)
MCV: 95.7 fL (ref 78.0–100.0)
MCV: 95.7 fL (ref 78.0–100.0)
MCV: 95.8 fL (ref 78.0–100.0)
MCV: 95.9 fL (ref 78.0–100.0)
MCV: 96.6 fL (ref 78.0–100.0)
MCV: 96.7 fL (ref 78.0–100.0)
MCV: 97.2 fL (ref 78.0–100.0)
MCV: 97.2 fL (ref 78.0–100.0)
MCV: 97.3 fL (ref 78.0–100.0)
MCV: 97.5 fL (ref 78.0–100.0)
Platelets: 102 10*3/uL — ABNORMAL LOW (ref 150–400)
Platelets: 119 10*3/uL — ABNORMAL LOW (ref 150–400)
Platelets: 120 10*3/uL — ABNORMAL LOW (ref 150–400)
Platelets: 162 10*3/uL (ref 150–400)
Platelets: 166 10*3/uL (ref 150–400)
Platelets: 207 10*3/uL (ref 150–400)
Platelets: 254 10*3/uL (ref 150–400)
Platelets: 69 10*3/uL — ABNORMAL LOW (ref 150–400)
RBC: 3.37 MIL/uL — ABNORMAL LOW (ref 4.22–5.81)
RBC: 3.43 MIL/uL — ABNORMAL LOW (ref 4.22–5.81)
RBC: 3.44 MIL/uL — ABNORMAL LOW (ref 4.22–5.81)
RBC: 3.52 MIL/uL — ABNORMAL LOW (ref 4.22–5.81)
RBC: 3.64 MIL/uL — ABNORMAL LOW (ref 4.22–5.81)
RBC: 3.72 MIL/uL — ABNORMAL LOW (ref 4.22–5.81)
RBC: 3.83 MIL/uL — ABNORMAL LOW (ref 4.22–5.81)
RBC: 3.93 MIL/uL — ABNORMAL LOW (ref 4.22–5.81)
RBC: 4.37 MIL/uL (ref 4.22–5.81)
RBC: 4.73 MIL/uL (ref 4.22–5.81)
RBC: 4.78 MIL/uL (ref 4.22–5.81)
RBC: 4.8 MIL/uL (ref 4.22–5.81)
RBC: 4.82 MIL/uL (ref 4.22–5.81)
RDW: 12.9 % (ref 11.5–15.5)
RDW: 13 % (ref 11.5–15.5)
RDW: 13.1 % (ref 11.5–15.5)
RDW: 13.1 % (ref 11.5–15.5)
RDW: 13.6 % (ref 11.5–15.5)
RDW: 13.7 % (ref 11.5–15.5)
RDW: 13.9 % (ref 11.5–15.5)
RDW: 13.9 % (ref 11.5–15.5)
WBC: 11 10*3/uL — ABNORMAL HIGH (ref 4.0–10.5)
WBC: 11.1 10*3/uL — ABNORMAL HIGH (ref 4.0–10.5)
WBC: 11.7 10*3/uL — ABNORMAL HIGH (ref 4.0–10.5)
WBC: 13.7 10*3/uL — ABNORMAL HIGH (ref 4.0–10.5)
WBC: 4.7 10*3/uL (ref 4.0–10.5)
WBC: 5.8 10*3/uL (ref 4.0–10.5)
WBC: 6.2 10*3/uL (ref 4.0–10.5)
WBC: 6.8 10*3/uL (ref 4.0–10.5)
WBC: 7.7 10*3/uL (ref 4.0–10.5)
WBC: 9.3 10*3/uL (ref 4.0–10.5)
WBC: 9.3 10*3/uL (ref 4.0–10.5)

## 2010-10-16 LAB — URINALYSIS, ROUTINE W REFLEX MICROSCOPIC
Bilirubin Urine: NEGATIVE
Glucose, UA: NEGATIVE mg/dL
Protein, ur: 30 mg/dL — AB
Protein, ur: >300 mg/dL — AB
Urobilinogen, UA: 1 mg/dL (ref 0.0–1.0)
pH: 5 (ref 5.0–8.0)

## 2010-10-16 LAB — GLUCOSE, CAPILLARY
Glucose-Capillary: 103 mg/dL — ABNORMAL HIGH (ref 70–99)
Glucose-Capillary: 103 mg/dL — ABNORMAL HIGH (ref 70–99)
Glucose-Capillary: 103 mg/dL — ABNORMAL HIGH (ref 70–99)
Glucose-Capillary: 104 mg/dL — ABNORMAL HIGH (ref 70–99)
Glucose-Capillary: 106 mg/dL — ABNORMAL HIGH (ref 70–99)
Glucose-Capillary: 108 mg/dL — ABNORMAL HIGH (ref 70–99)
Glucose-Capillary: 110 mg/dL — ABNORMAL HIGH (ref 70–99)
Glucose-Capillary: 110 mg/dL — ABNORMAL HIGH (ref 70–99)
Glucose-Capillary: 110 mg/dL — ABNORMAL HIGH (ref 70–99)
Glucose-Capillary: 118 mg/dL — ABNORMAL HIGH (ref 70–99)
Glucose-Capillary: 119 mg/dL — ABNORMAL HIGH (ref 70–99)
Glucose-Capillary: 122 mg/dL — ABNORMAL HIGH (ref 70–99)
Glucose-Capillary: 123 mg/dL — ABNORMAL HIGH (ref 70–99)
Glucose-Capillary: 124 mg/dL — ABNORMAL HIGH (ref 70–99)
Glucose-Capillary: 127 mg/dL — ABNORMAL HIGH (ref 70–99)
Glucose-Capillary: 131 mg/dL — ABNORMAL HIGH (ref 70–99)
Glucose-Capillary: 134 mg/dL — ABNORMAL HIGH (ref 70–99)
Glucose-Capillary: 138 mg/dL — ABNORMAL HIGH (ref 70–99)
Glucose-Capillary: 142 mg/dL — ABNORMAL HIGH (ref 70–99)
Glucose-Capillary: 143 mg/dL — ABNORMAL HIGH (ref 70–99)
Glucose-Capillary: 151 mg/dL — ABNORMAL HIGH (ref 70–99)
Glucose-Capillary: 157 mg/dL — ABNORMAL HIGH (ref 70–99)
Glucose-Capillary: 160 mg/dL — ABNORMAL HIGH (ref 70–99)
Glucose-Capillary: 162 mg/dL — ABNORMAL HIGH (ref 70–99)
Glucose-Capillary: 164 mg/dL — ABNORMAL HIGH (ref 70–99)
Glucose-Capillary: 166 mg/dL — ABNORMAL HIGH (ref 70–99)
Glucose-Capillary: 171 mg/dL — ABNORMAL HIGH (ref 70–99)
Glucose-Capillary: 173 mg/dL — ABNORMAL HIGH (ref 70–99)
Glucose-Capillary: 176 mg/dL — ABNORMAL HIGH (ref 70–99)
Glucose-Capillary: 177 mg/dL — ABNORMAL HIGH (ref 70–99)
Glucose-Capillary: 181 mg/dL — ABNORMAL HIGH (ref 70–99)
Glucose-Capillary: 188 mg/dL — ABNORMAL HIGH (ref 70–99)
Glucose-Capillary: 209 mg/dL — ABNORMAL HIGH (ref 70–99)
Glucose-Capillary: 220 mg/dL — ABNORMAL HIGH (ref 70–99)
Glucose-Capillary: 249 mg/dL — ABNORMAL HIGH (ref 70–99)
Glucose-Capillary: 314 mg/dL — ABNORMAL HIGH (ref 70–99)
Glucose-Capillary: 319 mg/dL — ABNORMAL HIGH (ref 70–99)
Glucose-Capillary: 324 mg/dL — ABNORMAL HIGH (ref 70–99)
Glucose-Capillary: 333 mg/dL — ABNORMAL HIGH (ref 70–99)
Glucose-Capillary: 339 mg/dL — ABNORMAL HIGH (ref 70–99)
Glucose-Capillary: 75 mg/dL (ref 70–99)
Glucose-Capillary: 78 mg/dL (ref 70–99)
Glucose-Capillary: 79 mg/dL (ref 70–99)
Glucose-Capillary: 79 mg/dL (ref 70–99)
Glucose-Capillary: 80 mg/dL (ref 70–99)
Glucose-Capillary: 82 mg/dL (ref 70–99)
Glucose-Capillary: 83 mg/dL (ref 70–99)
Glucose-Capillary: 83 mg/dL (ref 70–99)
Glucose-Capillary: 83 mg/dL (ref 70–99)
Glucose-Capillary: 84 mg/dL (ref 70–99)
Glucose-Capillary: 85 mg/dL (ref 70–99)
Glucose-Capillary: 86 mg/dL (ref 70–99)
Glucose-Capillary: 89 mg/dL (ref 70–99)
Glucose-Capillary: 90 mg/dL (ref 70–99)
Glucose-Capillary: 92 mg/dL (ref 70–99)
Glucose-Capillary: 92 mg/dL (ref 70–99)
Glucose-Capillary: 94 mg/dL (ref 70–99)
Glucose-Capillary: 94 mg/dL (ref 70–99)
Glucose-Capillary: 94 mg/dL (ref 70–99)
Glucose-Capillary: 94 mg/dL (ref 70–99)
Glucose-Capillary: 97 mg/dL (ref 70–99)
Glucose-Capillary: 98 mg/dL (ref 70–99)
Glucose-Capillary: 99 mg/dL (ref 70–99)

## 2010-10-16 LAB — BASIC METABOLIC PANEL
BUN: 17 mg/dL (ref 6–23)
BUN: 19 mg/dL (ref 6–23)
BUN: 21 mg/dL (ref 6–23)
BUN: 21 mg/dL (ref 6–23)
BUN: 23 mg/dL (ref 6–23)
BUN: 24 mg/dL — ABNORMAL HIGH (ref 6–23)
BUN: 26 mg/dL — ABNORMAL HIGH (ref 6–23)
BUN: 27 mg/dL — ABNORMAL HIGH (ref 6–23)
BUN: 31 mg/dL — ABNORMAL HIGH (ref 6–23)
BUN: 31 mg/dL — ABNORMAL HIGH (ref 6–23)
BUN: 32 mg/dL — ABNORMAL HIGH (ref 6–23)
BUN: 35 mg/dL — ABNORMAL HIGH (ref 6–23)
BUN: 39 mg/dL — ABNORMAL HIGH (ref 6–23)
BUN: 44 mg/dL — ABNORMAL HIGH (ref 6–23)
CO2: 13 mEq/L — ABNORMAL LOW (ref 19–32)
CO2: 16 mEq/L — ABNORMAL LOW (ref 19–32)
CO2: 16 mEq/L — ABNORMAL LOW (ref 19–32)
CO2: 18 mEq/L — ABNORMAL LOW (ref 19–32)
CO2: 19 mEq/L (ref 19–32)
CO2: 22 mEq/L (ref 19–32)
CO2: 24 mEq/L (ref 19–32)
CO2: 24 mEq/L (ref 19–32)
CO2: 25 mEq/L (ref 19–32)
CO2: 25 mEq/L (ref 19–32)
CO2: 25 mEq/L (ref 19–32)
CO2: 25 mEq/L (ref 19–32)
CO2: 26 mEq/L (ref 19–32)
CO2: 26 mEq/L (ref 19–32)
CO2: 29 mEq/L (ref 19–32)
Calcium: 6.3 mg/dL — CL (ref 8.4–10.5)
Calcium: 6.4 mg/dL — CL (ref 8.4–10.5)
Calcium: 6.5 mg/dL — ABNORMAL LOW (ref 8.4–10.5)
Calcium: 6.9 mg/dL — ABNORMAL LOW (ref 8.4–10.5)
Calcium: 7.1 mg/dL — ABNORMAL LOW (ref 8.4–10.5)
Calcium: 7.8 mg/dL — ABNORMAL LOW (ref 8.4–10.5)
Calcium: 7.8 mg/dL — ABNORMAL LOW (ref 8.4–10.5)
Calcium: 8 mg/dL — ABNORMAL LOW (ref 8.4–10.5)
Calcium: 8.2 mg/dL — ABNORMAL LOW (ref 8.4–10.5)
Calcium: 8.3 mg/dL — ABNORMAL LOW (ref 8.4–10.5)
Calcium: 8.3 mg/dL — ABNORMAL LOW (ref 8.4–10.5)
Calcium: 8.6 mg/dL (ref 8.4–10.5)
Chloride: 102 mEq/L (ref 96–112)
Chloride: 102 mEq/L (ref 96–112)
Chloride: 104 mEq/L (ref 96–112)
Chloride: 105 mEq/L (ref 96–112)
Chloride: 106 mEq/L (ref 96–112)
Chloride: 108 mEq/L (ref 96–112)
Chloride: 108 mEq/L (ref 96–112)
Chloride: 108 mEq/L (ref 96–112)
Chloride: 109 mEq/L (ref 96–112)
Chloride: 109 mEq/L (ref 96–112)
Chloride: 109 mEq/L (ref 96–112)
Chloride: 112 mEq/L (ref 96–112)
Chloride: 116 mEq/L — ABNORMAL HIGH (ref 96–112)
Chloride: 97 mEq/L (ref 96–112)
Creatinine, Ser: 0.78 mg/dL (ref 0.4–1.5)
Creatinine, Ser: 0.85 mg/dL (ref 0.4–1.5)
Creatinine, Ser: 0.92 mg/dL (ref 0.4–1.5)
Creatinine, Ser: 1.03 mg/dL (ref 0.4–1.5)
Creatinine, Ser: 1.04 mg/dL (ref 0.4–1.5)
Creatinine, Ser: 1.06 mg/dL (ref 0.4–1.5)
Creatinine, Ser: 1.18 mg/dL (ref 0.4–1.5)
Creatinine, Ser: 1.63 mg/dL — ABNORMAL HIGH (ref 0.4–1.5)
Creatinine, Ser: 1.69 mg/dL — ABNORMAL HIGH (ref 0.4–1.5)
Creatinine, Ser: 1.81 mg/dL — ABNORMAL HIGH (ref 0.4–1.5)
Creatinine, Ser: 1.84 mg/dL — ABNORMAL HIGH (ref 0.4–1.5)
Creatinine, Ser: 1.92 mg/dL — ABNORMAL HIGH (ref 0.4–1.5)
Creatinine, Ser: 1.92 mg/dL — ABNORMAL HIGH (ref 0.4–1.5)
Creatinine, Ser: 1.92 mg/dL — ABNORMAL HIGH (ref 0.4–1.5)
Creatinine, Ser: 1.99 mg/dL — ABNORMAL HIGH (ref 0.4–1.5)
Creatinine, Ser: 2.04 mg/dL — ABNORMAL HIGH (ref 0.4–1.5)
GFR calc Af Amer: 35 mL/min — ABNORMAL LOW (ref >60)
GFR calc Af Amer: 37 mL/min — ABNORMAL LOW (ref >60)
GFR calc Af Amer: 38 mL/min — ABNORMAL LOW (ref >60)
GFR calc Af Amer: 43 mL/min — ABNORMAL LOW (ref >60)
GFR calc Af Amer: 43 mL/min — ABNORMAL LOW (ref >60)
GFR calc Af Amer: 53 mL/min — ABNORMAL LOW (ref >60)
GFR calc Af Amer: 59 mL/min — ABNORMAL LOW (ref >60)
GFR calc Af Amer: >60 mL/min (ref >60)
GFR calc Af Amer: >60 mL/min (ref >60)
GFR calc Af Amer: >60 mL/min (ref >60)
GFR calc Af Amer: >60 mL/min (ref >60)
GFR calc Af Amer: >60 mL/min (ref >60)
GFR calc Af Amer: >60 mL/min (ref >60)
GFR calc Af Amer: >60 mL/min (ref >60)
GFR calc non Af Amer: 29 mL/min — ABNORMAL LOW (ref >60)
GFR calc non Af Amer: 30 mL/min — ABNORMAL LOW (ref >60)
GFR calc non Af Amer: 34 mL/min — ABNORMAL LOW (ref >60)
GFR calc non Af Amer: 34 mL/min — ABNORMAL LOW (ref >60)
GFR calc non Af Amer: 35 mL/min — ABNORMAL LOW (ref >60)
GFR calc non Af Amer: 36 mL/min — ABNORMAL LOW (ref >60)
GFR calc non Af Amer: 36 mL/min — ABNORMAL LOW (ref >60)
GFR calc non Af Amer: 38 mL/min — ABNORMAL LOW (ref >60)
GFR calc non Af Amer: 50 mL/min — ABNORMAL LOW (ref >60)
GFR calc non Af Amer: >60 mL/min (ref >60)
GFR calc non Af Amer: >60 mL/min (ref >60)
GFR calc non Af Amer: >60 mL/min (ref >60)
GFR calc non Af Amer: >60 mL/min (ref >60)
Glucose, Bld: 131 mg/dL — ABNORMAL HIGH (ref 70–99)
Glucose, Bld: 136 mg/dL — ABNORMAL HIGH (ref 70–99)
Glucose, Bld: 146 mg/dL — ABNORMAL HIGH (ref 70–99)
Glucose, Bld: 154 mg/dL — ABNORMAL HIGH (ref 70–99)
Glucose, Bld: 156 mg/dL — ABNORMAL HIGH (ref 70–99)
Glucose, Bld: 165 mg/dL — ABNORMAL HIGH (ref 70–99)
Glucose, Bld: 170 mg/dL — ABNORMAL HIGH (ref 70–99)
Glucose, Bld: 171 mg/dL — ABNORMAL HIGH (ref 70–99)
Glucose, Bld: 181 mg/dL — ABNORMAL HIGH (ref 70–99)
Glucose, Bld: 81 mg/dL (ref 70–99)
Glucose, Bld: 91 mg/dL (ref 70–99)
Glucose, Bld: 92 mg/dL (ref 70–99)
Glucose, Bld: 95 mg/dL (ref 70–99)
Potassium: 2.2 mEq/L — CL (ref 3.5–5.1)
Potassium: 2.4 mEq/L — CL (ref 3.5–5.1)
Potassium: 2.6 mEq/L — CL (ref 3.5–5.1)
Potassium: 2.6 mEq/L — CL (ref 3.5–5.1)
Potassium: 2.8 mEq/L — ABNORMAL LOW (ref 3.5–5.1)
Potassium: 3.3 mEq/L — ABNORMAL LOW (ref 3.5–5.1)
Potassium: 3.4 mEq/L — ABNORMAL LOW (ref 3.5–5.1)
Potassium: 3.4 mEq/L — ABNORMAL LOW (ref 3.5–5.1)
Potassium: 3.4 mEq/L — ABNORMAL LOW (ref 3.5–5.1)
Potassium: 3.5 mEq/L (ref 3.5–5.1)
Potassium: 3.6 mEq/L (ref 3.5–5.1)
Potassium: 4 mEq/L (ref 3.5–5.1)
Potassium: 4.3 mEq/L (ref 3.5–5.1)
Potassium: 4.3 mEq/L (ref 3.5–5.1)
Potassium: 4.5 mEq/L (ref 3.5–5.1)
Potassium: 4.7 mEq/L (ref 3.5–5.1)
Potassium: 5 mEq/L (ref 3.5–5.1)
Potassium: 5.6 mEq/L — ABNORMAL HIGH (ref 3.5–5.1)
Sodium: 128 mEq/L — ABNORMAL LOW (ref 135–145)
Sodium: 129 mEq/L — ABNORMAL LOW (ref 135–145)
Sodium: 131 mEq/L — ABNORMAL LOW (ref 135–145)
Sodium: 132 mEq/L — ABNORMAL LOW (ref 135–145)
Sodium: 135 mEq/L (ref 135–145)
Sodium: 137 mEq/L (ref 135–145)
Sodium: 137 mEq/L (ref 135–145)
Sodium: 140 mEq/L (ref 135–145)
Sodium: 144 mEq/L (ref 135–145)
Sodium: 145 mEq/L (ref 135–145)
Sodium: 145 mEq/L (ref 135–145)
Sodium: 149 mEq/L — ABNORMAL HIGH (ref 135–145)

## 2010-10-16 LAB — APTT
aPTT: 30 seconds (ref 24–37)
aPTT: 31 seconds (ref 24–37)

## 2010-10-16 LAB — URINE CULTURE
Colony Count: NO GROWTH
Colony Count: NO GROWTH
Culture: NO GROWTH

## 2010-10-16 LAB — POCT CARDIAC MARKERS: Troponin i, poc: <0.05 ng/mL (ref 0.00–0.09)

## 2010-10-16 LAB — MAGNESIUM
Magnesium: 1.5 mg/dL (ref 1.5–2.5)
Magnesium: 1.5 mg/dL (ref 1.5–2.5)
Magnesium: 1.6 mg/dL (ref 1.5–2.5)
Magnesium: 2.5 mg/dL (ref 1.5–2.5)
Magnesium: 2.6 mg/dL — ABNORMAL HIGH (ref 1.5–2.5)

## 2010-10-16 LAB — NA AND K (SODIUM & POTASSIUM), RAND UR
Potassium Urine: 23 mEq/L
Sodium, Ur: 103 mEq/L

## 2010-10-16 LAB — PROTIME-INR
INR: 1.2 (ref 0.00–1.49)
INR: 1.23 (ref 0.00–1.49)
Prothrombin Time: 13.5 seconds (ref 11.6–15.2)
Prothrombin Time: 15.1 seconds (ref 11.6–15.2)
Prothrombin Time: 15.4 seconds — ABNORMAL HIGH (ref 11.6–15.2)
Prothrombin Time: 16 seconds — ABNORMAL HIGH (ref 11.6–15.2)

## 2010-10-16 LAB — BLOOD GAS, ARTERIAL
Acid-base deficit: 12.9 mmol/L — ABNORMAL HIGH (ref 0.0–2.0)
Acid-base deficit: 2.2 mmol/L — ABNORMAL HIGH (ref 0.0–2.0)
Acid-base deficit: 2.3 mmol/L — ABNORMAL HIGH (ref 0.0–2.0)
Bicarbonate: 13 mEq/L — ABNORMAL LOW (ref 20.0–24.0)
Bicarbonate: 13.1 mEq/L — ABNORMAL LOW (ref 20.0–24.0)
Bicarbonate: 13.7 mEq/L — ABNORMAL LOW (ref 20.0–24.0)
Bicarbonate: 15.4 mEq/L — ABNORMAL LOW (ref 20.0–24.0)
Bicarbonate: 21.8 mEq/L (ref 20.0–24.0)
Bicarbonate: 21.8 mEq/L (ref 20.0–24.0)
Drawn by: 290171
FIO2: 0.4 %
FIO2: 0.4 %
FIO2: 0.5 %
FIO2: 0.6 %
O2 Saturation: 96.4 %
O2 Saturation: 97 %
O2 Saturation: 97.8 %
O2 Saturation: 99.2 %
O2 Saturation: 99.5 %
PEEP: 5 cmH2O
PEEP: 5 cmH2O
PEEP: 5 cmH2O
PEEP: 5 cmH2O
Patient temperature: 91.4
Patient temperature: 91.4
Patient temperature: 95
Patient temperature: 98.6
Patient temperature: 98.6
Patient temperature: 99
RATE: 12 resp/min
RATE: 32 resp/min
TCO2: 14.2 mmol/L (ref 0–100)
TCO2: 17.2 mmol/L (ref 0–100)
TCO2: 22.8 mmol/L (ref 0–100)
TCO2: 23 mmol/L (ref 0–100)
pCO2 arterial: 22.1 mmHg — ABNORMAL LOW (ref 35.0–45.0)
pCO2 arterial: 22.4 mmHg — ABNORMAL LOW (ref 35.0–45.0)
pCO2 arterial: 33.8 mmHg — ABNORMAL LOW (ref 35.0–45.0)
pCO2 arterial: 48.2 mmHg — ABNORMAL HIGH (ref 35.0–45.0)
pH, Arterial: 7.098 — CL (ref 7.350–7.450)
pH, Arterial: 7.385 (ref 7.350–7.450)
pH, Arterial: 7.422 (ref 7.350–7.450)
pO2, Arterial: 128 mmHg — ABNORMAL HIGH (ref 80.0–100.0)
pO2, Arterial: 201 mmHg — ABNORMAL HIGH (ref 80.0–100.0)
pO2, Arterial: 63.3 mmHg — ABNORMAL LOW (ref 80.0–100.0)
pO2, Arterial: 85.5 mmHg (ref 80.0–100.0)
pO2, Arterial: 91.3 mmHg (ref 80.0–100.0)
pO2, Arterial: 95.4 mmHg (ref 80.0–100.0)

## 2010-10-16 LAB — COMPREHENSIVE METABOLIC PANEL
AST: 30 U/L (ref 0–37)
AST: 307 U/L — ABNORMAL HIGH (ref 0–37)
Albumin: 3.8 g/dL (ref 3.5–5.2)
Alkaline Phosphatase: 47 U/L (ref 39–117)
BUN: 18 mg/dL (ref 6–23)
BUN: 7 mg/dL (ref 6–23)
CO2: 14 mEq/L — ABNORMAL LOW (ref 19–32)
CO2: 26 mEq/L (ref 19–32)
CO2: 26 mEq/L (ref 19–32)
Calcium: 8.4 mg/dL (ref 8.4–10.5)
Chloride: 107 mEq/L (ref 96–112)
Chloride: 99 mEq/L (ref 96–112)
Creatinine, Ser: 0.83 mg/dL (ref 0.4–1.5)
Creatinine, Ser: 0.85 mg/dL (ref 0.4–1.5)
Creatinine, Ser: 1.74 mg/dL — ABNORMAL HIGH (ref 0.4–1.5)
GFR calc Af Amer: >60 mL/min (ref >60)
GFR calc non Af Amer: 40 mL/min — ABNORMAL LOW (ref >60)
GFR calc non Af Amer: >60 mL/min (ref >60)
GFR calc non Af Amer: >60 mL/min (ref >60)
Glucose, Bld: 77 mg/dL (ref 70–99)
Potassium: 2.8 mEq/L — ABNORMAL LOW (ref 3.5–5.1)
Total Bilirubin: 0.6 mg/dL (ref 0.3–1.2)
Total Bilirubin: 1.1 mg/dL (ref 0.3–1.2)

## 2010-10-16 LAB — BRAIN NATRIURETIC PEPTIDE
Pro B Natriuretic peptide (BNP): 181 pg/mL — ABNORMAL HIGH (ref 0.0–100.0)
Pro B Natriuretic peptide (BNP): 420 pg/mL — ABNORMAL HIGH (ref 0.0–100.0)
Pro B Natriuretic peptide (BNP): 63 pg/mL (ref 0.0–100.0)

## 2010-10-16 LAB — POCT I-STAT, CHEM 8
BUN: 24 mg/dL — ABNORMAL HIGH (ref 6–23)
Calcium, Ion: 0.86 mmol/L — ABNORMAL LOW (ref 1.12–1.32)
Creatinine, Ser: 1.4 mg/dL (ref 0.4–1.5)
Hemoglobin: 17 g/dL (ref 13.0–17.0)
TCO2: 12 mmol/L (ref 0–100)

## 2010-10-16 LAB — HEPATIC FUNCTION PANEL
ALT: 181 U/L — ABNORMAL HIGH (ref 0–53)
AST: 102 U/L — ABNORMAL HIGH (ref 0–37)
Albumin: 2.8 g/dL — ABNORMAL LOW (ref 3.5–5.2)
Bilirubin, Direct: 0.4 mg/dL — ABNORMAL HIGH (ref 0.0–0.3)
Total Bilirubin: 0.9 mg/dL (ref 0.3–1.2)

## 2010-10-16 LAB — URINE MICROSCOPIC-ADD ON

## 2010-10-16 LAB — CHLORIDE, URINE, RANDOM: Chloride Urine: 112 mEq/L

## 2010-10-16 LAB — DIFFERENTIAL
Basophils Absolute: 0 10*3/uL (ref 0.0–0.1)
Basophils Relative: 0 % (ref 0–1)
Basophils Relative: 0 % (ref 0–1)
Eosinophils Absolute: 0.1 10*3/uL (ref 0.0–0.7)
Eosinophils Absolute: 0.3 10*3/uL (ref 0.0–0.7)
Eosinophils Relative: 1 % (ref 0–5)
Eosinophils Relative: 3 % (ref 0–5)
Lymphocytes Relative: 11 % — ABNORMAL LOW (ref 12–46)
Lymphs Abs: 0.8 10*3/uL (ref 0.7–4.0)
Lymphs Abs: 1.4 10*3/uL (ref 0.7–4.0)
Monocytes Absolute: 0.4 10*3/uL (ref 0.1–1.0)
Monocytes Relative: 13 % — ABNORMAL HIGH (ref 3–12)
Neutro Abs: 2.6 10*3/uL (ref 1.7–7.7)
Neutrophils Relative %: 78 % — ABNORMAL HIGH (ref 43–77)

## 2010-10-16 LAB — POCT I-STAT 3, ART BLOOD GAS (G3+)
Bicarbonate: 14.4 mEq/L — ABNORMAL LOW (ref 20.0–24.0)
pCO2 arterial: 41.8 mmHg (ref 35.0–45.0)
pH, Arterial: 7.147 — CL (ref 7.350–7.450)
pO2, Arterial: 127 mmHg — ABNORMAL HIGH (ref 80.0–100.0)

## 2010-10-16 LAB — URIC ACID: Uric Acid, Serum: 4.3 mg/dL (ref 4.0–7.8)

## 2010-10-16 LAB — CULTURE, BLOOD (ROUTINE X 2)
Culture: NO GROWTH
Culture: NO GROWTH

## 2010-10-16 LAB — CARDIAC PANEL(CRET KIN+CKTOT+MB+TROPI): Troponin I: 3.02 ng/mL (ref 0.00–0.06)

## 2010-10-16 LAB — CROSSMATCH
ABO/RH(D): A NEG
Antibody Screen: NEGATIVE

## 2010-10-16 LAB — STREP PNEUMONIAE URINARY ANTIGEN: Strep Pneumo Urinary Antigen: NEGATIVE

## 2010-10-16 LAB — TRIGLYCERIDES: Triglycerides: 188 mg/dL — ABNORMAL HIGH (ref <150)

## 2010-10-16 LAB — CALCIUM, IONIZED: Calcium, Ion: 1.23 mmol/L (ref 1.12–1.32)

## 2010-10-16 LAB — CULTURE, BAL-QUANTITATIVE W GRAM STAIN

## 2010-10-16 LAB — CK TOTAL AND CKMB (NOT AT ARMC): Relative Index: 4.2 — ABNORMAL HIGH (ref 0.0–2.5)

## 2010-10-16 LAB — PHOSPHORUS
Phosphorus: 1.8 mg/dL — ABNORMAL LOW (ref 2.3–4.6)
Phosphorus: 3.7 mg/dL (ref 2.3–4.6)

## 2010-10-16 LAB — CULTURE, RESPIRATORY W GRAM STAIN

## 2010-10-16 LAB — CARBOXYHEMOGLOBIN
Carboxyhemoglobin: 0.4 % — ABNORMAL LOW (ref 0.5–1.5)
O2 Saturation: 79.9 %

## 2010-10-16 LAB — CREATININE, URINE, RANDOM: Creatinine, Urine: 68.7 mg/dL

## 2010-10-16 LAB — HEMOCCULT GUIAC POC 1CARD (OFFICE): Fecal Occult Bld: POSITIVE

## 2010-10-16 LAB — TROPONIN I: Troponin I: 1.54 ng/mL (ref 0.00–0.06)

## 2010-11-08 ENCOUNTER — Encounter: Payer: Self-pay | Admitting: Internal Medicine

## 2010-11-08 ENCOUNTER — Ambulatory Visit (INDEPENDENT_AMBULATORY_CARE_PROVIDER_SITE_OTHER): Payer: BC Managed Care – PPO | Admitting: Internal Medicine

## 2010-11-08 DIAGNOSIS — E876 Hypokalemia: Secondary | ICD-10-CM

## 2010-11-08 DIAGNOSIS — I10 Essential (primary) hypertension: Secondary | ICD-10-CM

## 2010-11-08 DIAGNOSIS — I469 Cardiac arrest, cause unspecified: Secondary | ICD-10-CM

## 2010-11-08 NOTE — Assessment & Plan Note (Signed)
Without recurrent arrhythmia

## 2010-11-08 NOTE — Progress Notes (Signed)
HPI  Robert York is a 60 y.o. male  Seen in followup for cardiac arrest in the setting of hypokalemia and alcohol. He has been sober now for 13 months. He has normalization of left ventricular systolic function. The patient denies chest pain, shortness of breath, nocturnal dyspnea, orthopnea or peripheral edema.  There have been no palpitations, lightheadedness or syncope.   Past Medical History  Diagnosis Date  . Hyperlipidemia   . Hypertension   . Allergic rhinitis due to pollen   . Ventricular fibrillation 2011    leading to cardiac arrest  . Respiratory distress, acute   . Drug withdrawal     alcohol withdrawal  . Hypernatremia   . Propionibacterium infection 09/29/2009    1 set of blood culture   . MRSA (methicillin resistant staphylococcus aureus) pneumonia   . Thrombocytopenia   . DVT (deep venous thrombosis)     left calf and left greater saphenous vein   . Coronary artery disease   . COPD (chronic obstructive pulmonary disease)   . Alcohol dependence     history of  . Anxiety disorder     Past Surgical History  Procedure Date  . Back surgery   . Tonsillectomy   . Hernia repair     Current Outpatient Prescriptions  Medication Sig Dispense Refill  . ARIPiprazole (ABILIFY) 5 MG tablet Take 5 mg by mouth daily.        . Azelastine HCl (ASTEPRO) 0.15 % SOLN 1 spray by Nasal route 2 (two) times daily as needed.        . carvedilol (COREG) 6.25 MG tablet Take 6.25 mg by mouth 2 (two) times daily with a meal.        . ergocalciferol (VITAMIN D2) 50000 UNITS capsule Take 50,000 Units by mouth once a week.        . felodipine (PLENDIL) 5 MG 24 hr tablet Take 5 mg by mouth daily.        . fish oil-omega-3 fatty acids 1000 MG capsule Take 2 g by mouth daily.        . furosemide (LASIX) 20 MG tablet Take 20 mg by mouth daily.        [name] levalbuterol (XOPENEX HFA) 45 MCG/ACT inhaler Inhale 1-2 puffs into the lungs every 4 (four) hours as needed.        . magnesium oxide  (MAG-OX) 400 MG tablet Take 400 mg by mouth daily.        . montelukast (SINGULAIR) 10 MG tablet Take 10 mg by mouth at bedtime.        [name] omeprazole (PRILOSEC) 20 MG capsule Take 20 mg by mouth 2 (two) times daily.        . potassium chloride SA (K-DUR,KLOR-CON) 20 MEQ tablet Take 20 mEq by mouth daily.        . sertraline (ZOLOFT) 50 MG tablet Take 50 mg by mouth daily.        . Thiamine HCl (VITAMIN B-1) 100 MG tablet Take 100 mg by mouth daily.        . valsartan-hydrochlorothiazide (DIOVAN-HCT) 80-12.5 MG per tablet Take 1 tablet by mouth daily.        [name] zolpidem (AMBIEN) 10 MG tablet Take 10 mg by mouth at bedtime as needed.        [name] acetaminophen (TYLENOL) 325 MG tablet Take 650 mg by mouth every 4 (four) hours as needed.        [name] DISCONTD: acetaminophen (TYLENOL)  500 MG tablet Take 1,000 mg by mouth every 6 (six) hours as needed.        [name] DISCONTD: diphenhydrAMINE (SOMINEX) 25 MG tablet Take 25 mg by mouth at bedtime as needed.          Allergies  Allergen Reactions  . Amoxicillin     REACTION: rash  . Lisinopril     Review of Systems negative except from HPI and PMH  Physical Exam Well developed and well nourished in no acute distress HENT normal E scleral and icterus clear Neck Supple JVP flat; carotids brisk and full Clear to ausculation Regular rate and rhythm, no murmurs gallops or rub Soft with active bowel sounds No clubbing cyanosis and edema Alert and oriented, grossly normal motor and sensory function Skin Warm and Dry  ECG Sinus rhythm at 58 Intervals 0.22/0.13/0.44 Otherwise normal Assessment and  Plan

## 2010-11-08 NOTE — Assessment & Plan Note (Signed)
I have asked him to discuss with Dr. Eula Listen with her needs double diuretic therapy. Hypokalemia is a major concern

## 2010-11-08 NOTE — Patient Instructions (Signed)
Your physician recommends that you schedule a follow-up appointment in: YEAR WITH DR KLEIN  Your physician recommends that you continue on your current medications as directed. Please refer to the Current Medication list given to you today. 

## 2010-11-08 NOTE — Assessment & Plan Note (Addendum)
-   As below

## 2010-12-08 NOTE — Op Note (Signed)
[facility]  Patient:    Robert York, Robert York                      MRN: [id number] Proc. Date: 12/21/99 Adm. Date:  [id number] Disc. Date: [id number] Attending:  Arlis Porta CC:         Lum Babe, M.D.             Barron Alvine, M.D.                           Operative Report  PREOPERATIVE DIAGNOSIS:  Right inguinal hernia.  POSTOPERATIVE DIAGNOSIS:  Right pantaloon inguinal hernia.  OPERATION:  Right inguinal hernia repair with mesh.  SURGEON:  Adolph Pollack, M.D.  ANESTHESIA:  Local (1% xylocaine with epinephrine plus 0.5% plain Marcaine plus bicarbonate) with MAC.  INDICATION:  Mr. Bowdish is a 60 year old male who has noticed a painful bulge in the right groin.  On examination, it is consistent with a right inguinal hernia.  The left inguinal floor is solid.  He now presents for repair of the inguinal hernia.  The procedure and risks have been explained to him.  TECHNIQUE:  He was placed supine on the operating table and given intravenous sedation. The right groin area was then shaved, marked and then sterilely prepped and draped.  Local anesthetic was infiltrated in an oblique fashion, the right groin area both superficially and deep and an incision was made through the skin and carried down to the external oblique of the aponeurosis with the cautery.  More local anesthetic was infiltrated deep to the external oblique aponeurosis and then it was split in the direction of its fibers. Using blunt dissection, the shelving edge of the inguinal ligament was identified and the internal oblique aponeurosis and muscle identified superiorly.  The ilioinguinal nerve was identified and left with the cord. The cord was isolated and then indirect sac and lipoma of the cord were noted. The indirect sac was freed from the cord, ligated with 2-0 silk suture and reduced back into the peritoneal cavity.  He also had a direct hernia defect which  was easily reducible.  Next, a piece of 3 x 6 inch mesh was brought to the field and anchored to 1 to 2 cm medial to the pubic tubercle using 2-0 chromic suture.  The inferior edge of the mesh was anchored to the shelving edge of the inguinal ligament with a running 2-0 Prolene suture up to the level of the internal ring.  A slit was cut in the mesh and this was wrapped around the cord.  The superior aspect of the mesh was anchored to the internal oblique muscle aponeurosis superiorly with interrupted 2-0 Vicryl sutures.  The two tails were then crossed around the cord creating a new internal ring and the tails were anchored to the shelving edge of the inguinal ligament with a single 2-0 Prolene suture.  This allowed for the introduction of a hemostat through the new internal ring aperture in the mesh.  After this, hemostasis was noted to be adequate.  The lateral edges of the mesh were tucked deep to the external oblique aponeurosis which was then closed with a running 2-0 Vicryl suture.  Scarpas fascia was closed with a running 3-0 Vicryl suture.  The skin was closed with a running 4-0 Monocryl subcuticular stitch followed by Steri-Strips and sterile dressings.  Needle and sponge counts were  correct before closure.  He tolerated the procedure well without any apparent complications.  The right testicle was in its appropriate position in the scrotum.  He was taken to the recovery room in satisfactory condition. DD:  12/21/99 TD:  12/25/99 Job: [id number]

## 2010-12-08 NOTE — Cardiovascular Report (Signed)
[place]  Patient:    Robert York, Robert York                           MRN: [id number] Proc. Date: 09/15/99 Adm. Date:  [id number] Disc. Date: [id number] Attending:  Corliss Marcus CC:         Lum Babe, M.D.             Cardiac Catheterization Laboratory                        Cardiac Catheterization  CINE NUMBER:  [id number]  REFERRING PHYSICIAN:  Lum Babe, M.D.  PROCEDURE PERFORMED: 1. Left heart catheterization. 2. Coronary angiography. 3. Left ventriculogram.  INDICATIONS:  [name] is a 60 year old man with multiple risk factors for significant coronary disease, who underwent exercise myocardial perfusion ______, which was abnormal in that the patient demonstrated good exercise tolerance but frequent PVCs were noted in the rest phase.  As well, the perfusion images demonstrated some apical thinning and septal dropout which was concerning for coronary ischemia.  As well, the ejection fraction was not entirely normal,  calculated at 48%.  He is brought now to the cardiac catheterization laboratory to definitely rule out the possibility of coronary artery disease.  DESCRIPTION OF PROCEDURE:  The patient was brought to the cardiac catheterization laboratory in the postabsorptive state.  The right groin was prepared and draped in the usual sterile fashion.  Local anesthesia was obtained with the infiltration of 1% lidocaine.  A  5 French catheter sheath was introduced percutaneously into the right femoral artery utilizing an anterior approach over a guiding J wire.  A 5  French 110 cm pigtail catheter was then advanced to the ascending aorta where the pressure was recorded.  The catheter was then prolapsed across the aortic valve and the pressure was again recorded in the left ventricle both prior to and following the ventriculogram.  A 30 degree RAO cine left ventriculogram was performed utilizing a power injector.  Nonionic contrast  material of 45 cc were injected t 13 cc/sec.  The pigtail catheter was then exchanged for a 5 [place], #4 left Judkins catheter.  Following the sublingual administration of 0.4 mg nitroglycerin, cineangiography of the left coronary artery was conducted in multiple LAO and room air projections.  A 5 [place], #4 left Judkins catheter was exchanged for a 5 [place], #4 right Judkins catheter.  Cineangiography of the right coronary artery was conducted in LAO and room air projections.  At the completion of the procedure, the catheter and catheter sheaths were removed. Hemostasis was achieved by direct pressure.  The patient was transported to the  recovery area in stable condition with intact distal pulses.  FLUOROSCOPY TIME:  2.5 minutes.  TOTAL CONTRAST UTILIZED:  Omnipaque 120 cc.  HEMODYNAMICS:  Systemic arterial pressure was 125/77 with a mean of 97 mmHg. There was no systolic gradient across the aortic valve.  The left ventricular end-diastolic pressure was 8 mmHg preventriculogram and 12 mmHg post ventriculogram.  ANGIOGRAPHY:  LEFT VENTRICULOGRAM:  The left ventriculogram demonstrated normal chamber size nd overall normal global systolic function without significant regional wall motion abnormality.  The calculated ejection fraction utilizing a single plane cine method was 69%.  The end-diastolic volume was calculated at 112 mL and the end-systolic volume at 35 mL.  There was no mitral regurgitation.  There was no coronary calcification seen.  CORONARY ANGIOGRAPHY:  There was a left dominant coronary system present.  The main left coronary artery was normal.  The left anterior descending:  The left anterior descending artery and its branches were normal.  Left circumflex:  The left circumflex artery and its branches were normal. There was a ramus intermedius branch that was quite large followed by a smaller but still moderate sized first marginal branch which  arose right next to the ramus intermedius.  The ongoing circumflex did not give sizable branches until it reached the obtuse marginal of the heart and then finally the posterior descending artery and AV nodal artery was seen to arise from the distal circumflex.  Right coronary artery:  The right coronary artery and its branches was small and nondominant.  It did give an acute marginal branch which extended to the distal  septum and gave several small septal perforators in the apical portion of the distal septum.  FINAL IMPRESSION: 1. Normal left ventricular size and systolic function. 2. Normal coronary arteries, left dominant circulation.  PLAN:  The patient is presented with this gratifying news.  He will be discharged for outpatient followup.  No further therapy or evaluation is anticipated at this time. DD:  09/15/99 TD:  09/16/99 Job: [id number]

## 2010-12-08 NOTE — Procedures (Signed)
[facility]  Patient:    Robert York, Robert York                           MRN: [id number] Proc. Date: 05/08/00 Attending:  Verlin Grills, M.D. CC:         Francisca December, M.D.   Procedure Report  PROCEDURE:  Colonoscopy and biopsy.  REFERRING PHYSICIAN:  Francisca December, M.D.  PROCEDURE INDICATION:  Robert York is a 60 year old male who has undergone removal of neoplastic polyps colonoscopically in the past.  He is due for a surveillance colonoscopy.  I discussed with Robert York the complications associated with colonoscopy and polypectomy, including a one per thousand risk of bleeding and four per thousand risk of intestinal perforation requiring surgical repair.  Robert York has signed the operative permit.  ENDOSCOPIST:  Verlin Grills, M.D.  PREMEDICATION:  Demerol 50 mg, Versed 7.5 mg.  ENDOSCOPE:  Olympus pediatric colonoscope.  DESCRIPTION OF PROCEDURE:  After obtaining informed consent, the patient was placed in the left lateral decubitus position.  I administered intravenous Demerol and intravenous Versed to achieve conscious sedation for the procedure.  The patients blood pressure, oxygen saturation, and cardiac rhythm were monitored throughout the procedure and documented in the medical record.  Anal inspection was normal.  Digital rectal exam revealed an enlarged but non-nodular prostate.  The Olympus pediatric video colonoscope was introduced into the rectum and under direct vision advanced to the cecum, as identified by a normal-appearing ileocecal valve.  Colonic preparation for the exam today was excellent.  Rectum:  Normal.  Sigmoid colon and descending colon:  At 60 cm from the anal verge, in the proximal sigmoid colon, a 1 mm sessile polyp was removed with the cold biopsy forceps.  Splenic flexure normal.  Transverse colon normal.  Hepatic flexure:  From the hepatic flexure, a 1 mm sessile polyp was  removed with cold biopsy forceps.  Ascending colon normal.  Cecum and ileocecal valve normal.  ASSESSMENT:  A 1 mm sessile polyp was removed from the hepatic flexure and a 1 mm polyp was removed from the proximal sigmoid colon; both polyps were removed with the cold biopsy forceps.  RECOMMENDATIONS:  Repeat colonoscopy in five years. DD:  05/08/00 TD:  05/08/00 Job: [id number]

## 2011-01-29 ENCOUNTER — Other Ambulatory Visit: Payer: Self-pay | Admitting: *Deleted

## 2011-01-29 MED ORDER — CARVEDILOL 6.25 MG PO TABS: 6.2500 mg | ORAL_TABLET | Freq: Two times a day (BID) | ORAL | Status: DC

## 2011-02-06 ENCOUNTER — Other Ambulatory Visit: Payer: Self-pay | Admitting: *Deleted

## 2011-02-06 MED ORDER — CARVEDILOL 6.25 MG PO TABS: 6.2500 mg | ORAL_TABLET | Freq: Two times a day (BID) | ORAL | Status: DC

## 2011-02-14 IMAGING — CR DG CHEST 1V PORT
1 series · 1 of 1 positions shown · non-contrast
Comparison: Earlier film, same date.

CLINICAL DATA: Cardiac arrest.  Endotracheal tube placement.

PORTABLE CHEST - 1 VIEW

[view not recorded]
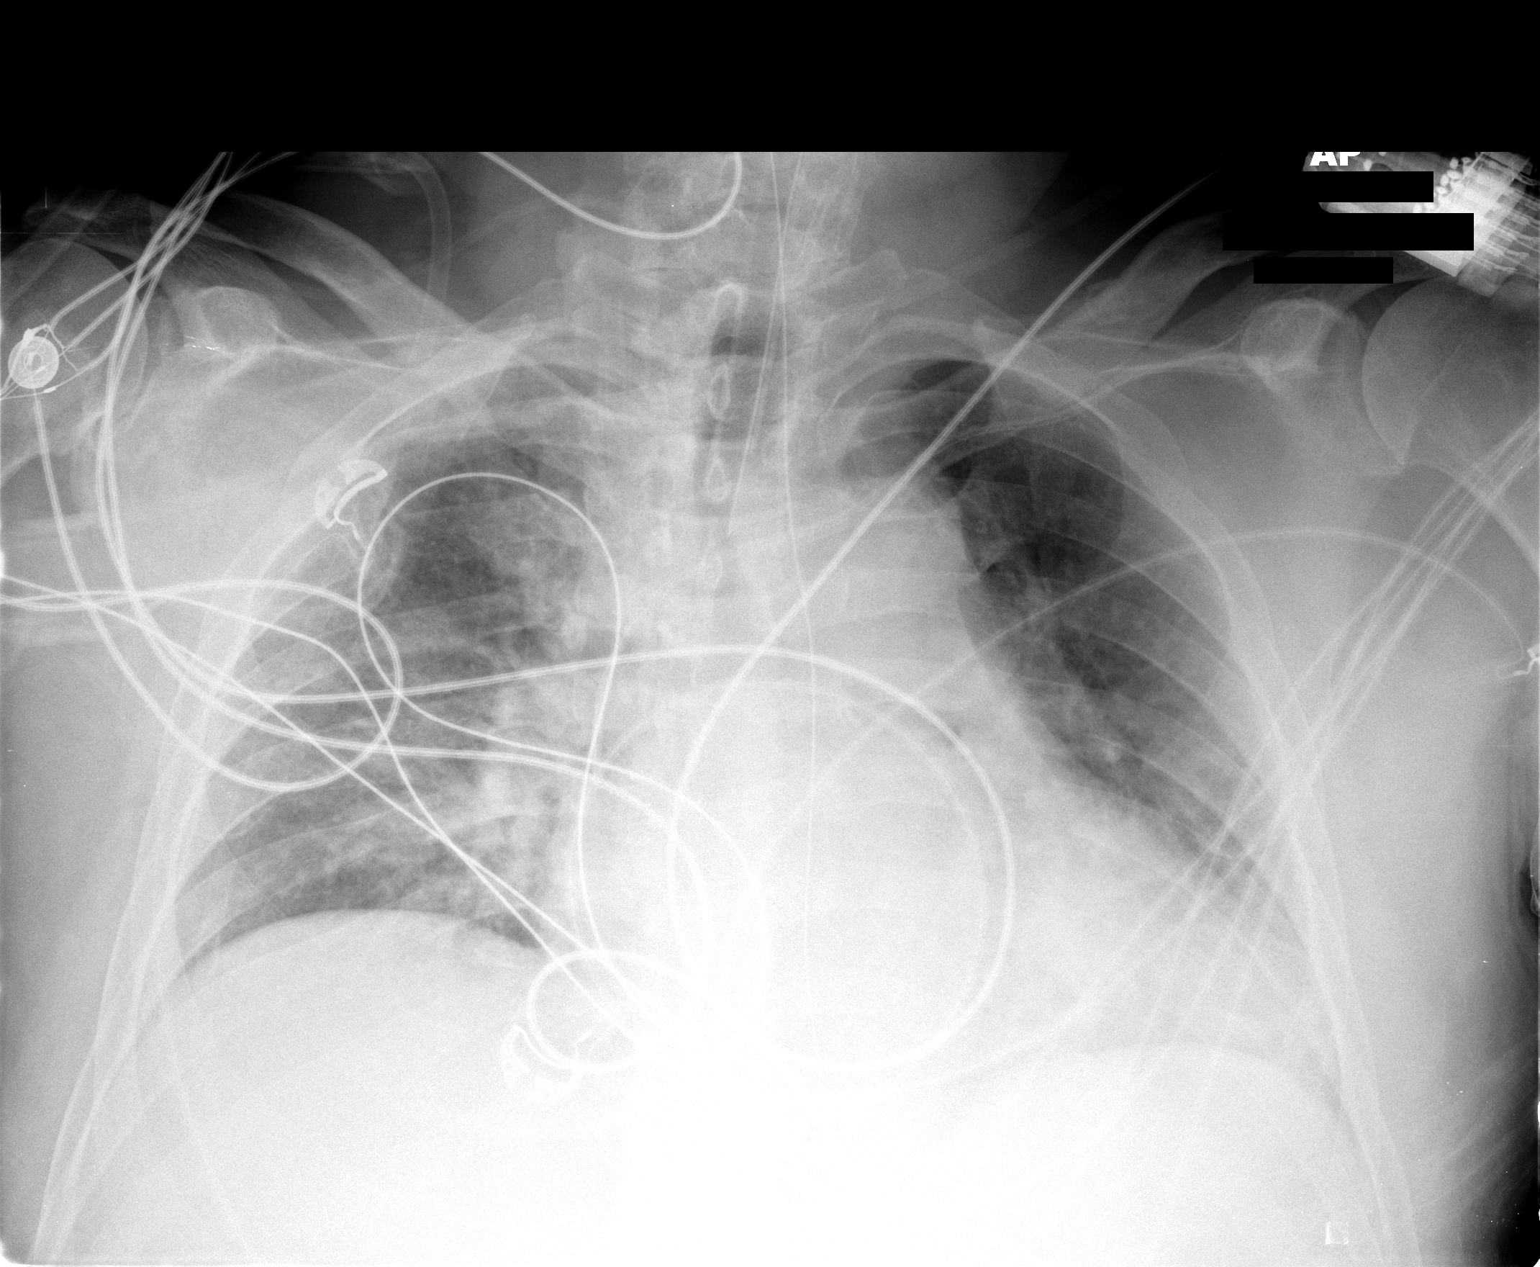

[1 of 1 positions shown; findings below may reference images not displayed]

FINDINGS: The endotracheal tube is 3.7 cm above the carina.  The NG
tube is in the stomach.  The heart is enlarged.  There is vascular
congestion and perihilar pulmonary edema.  No definite pleural
effusions.  No pneumothorax.
IMPRESSION: 1.  Endotracheal tube in good position, 3.7 cm above the carina.
2.  Cardiac enlargement with vascular congestion and mild perihilar
edema.

## 2011-02-16 IMAGING — CR DG ABD PORTABLE 1V
2 series · 2 of 2 positions shown · non-contrast
Comparison: [DATE]

CLINICAL DATA: Ileus

ABDOMEN - 1 VIEW

[view not recorded (1 of 2)]
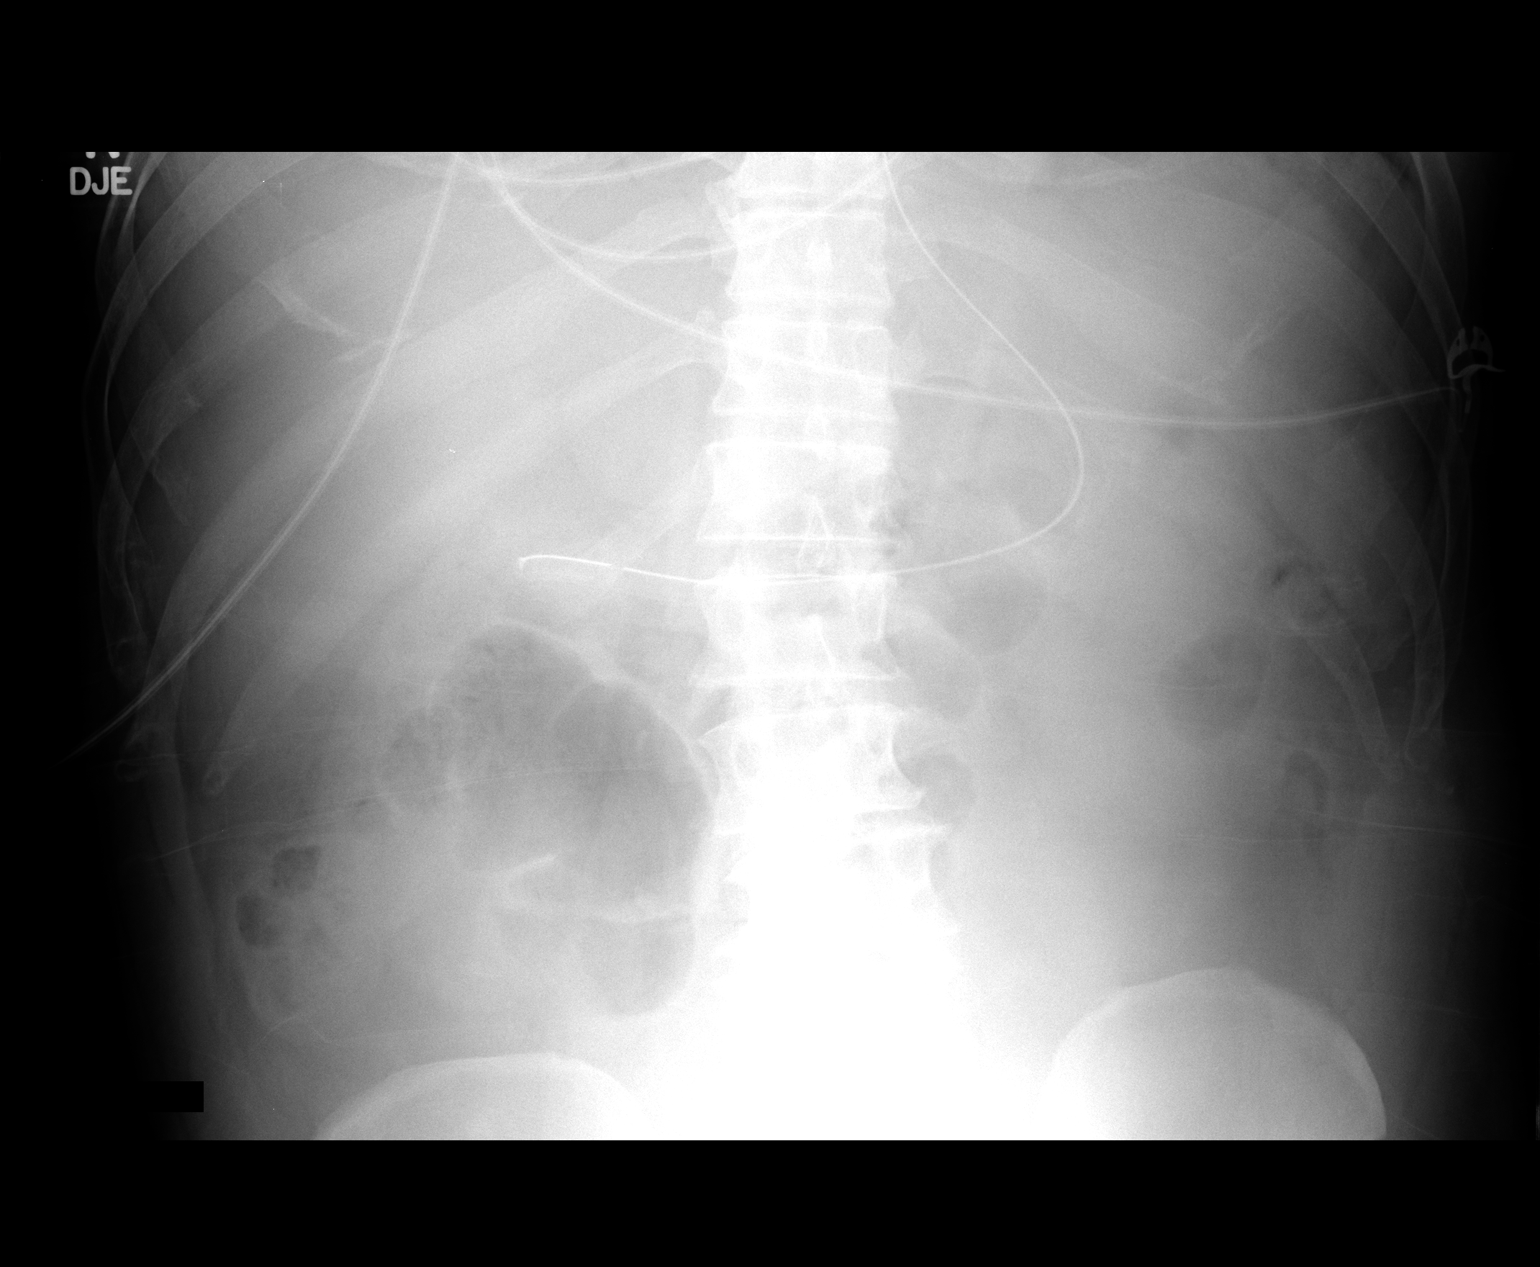

[view not recorded (2 of 2)]
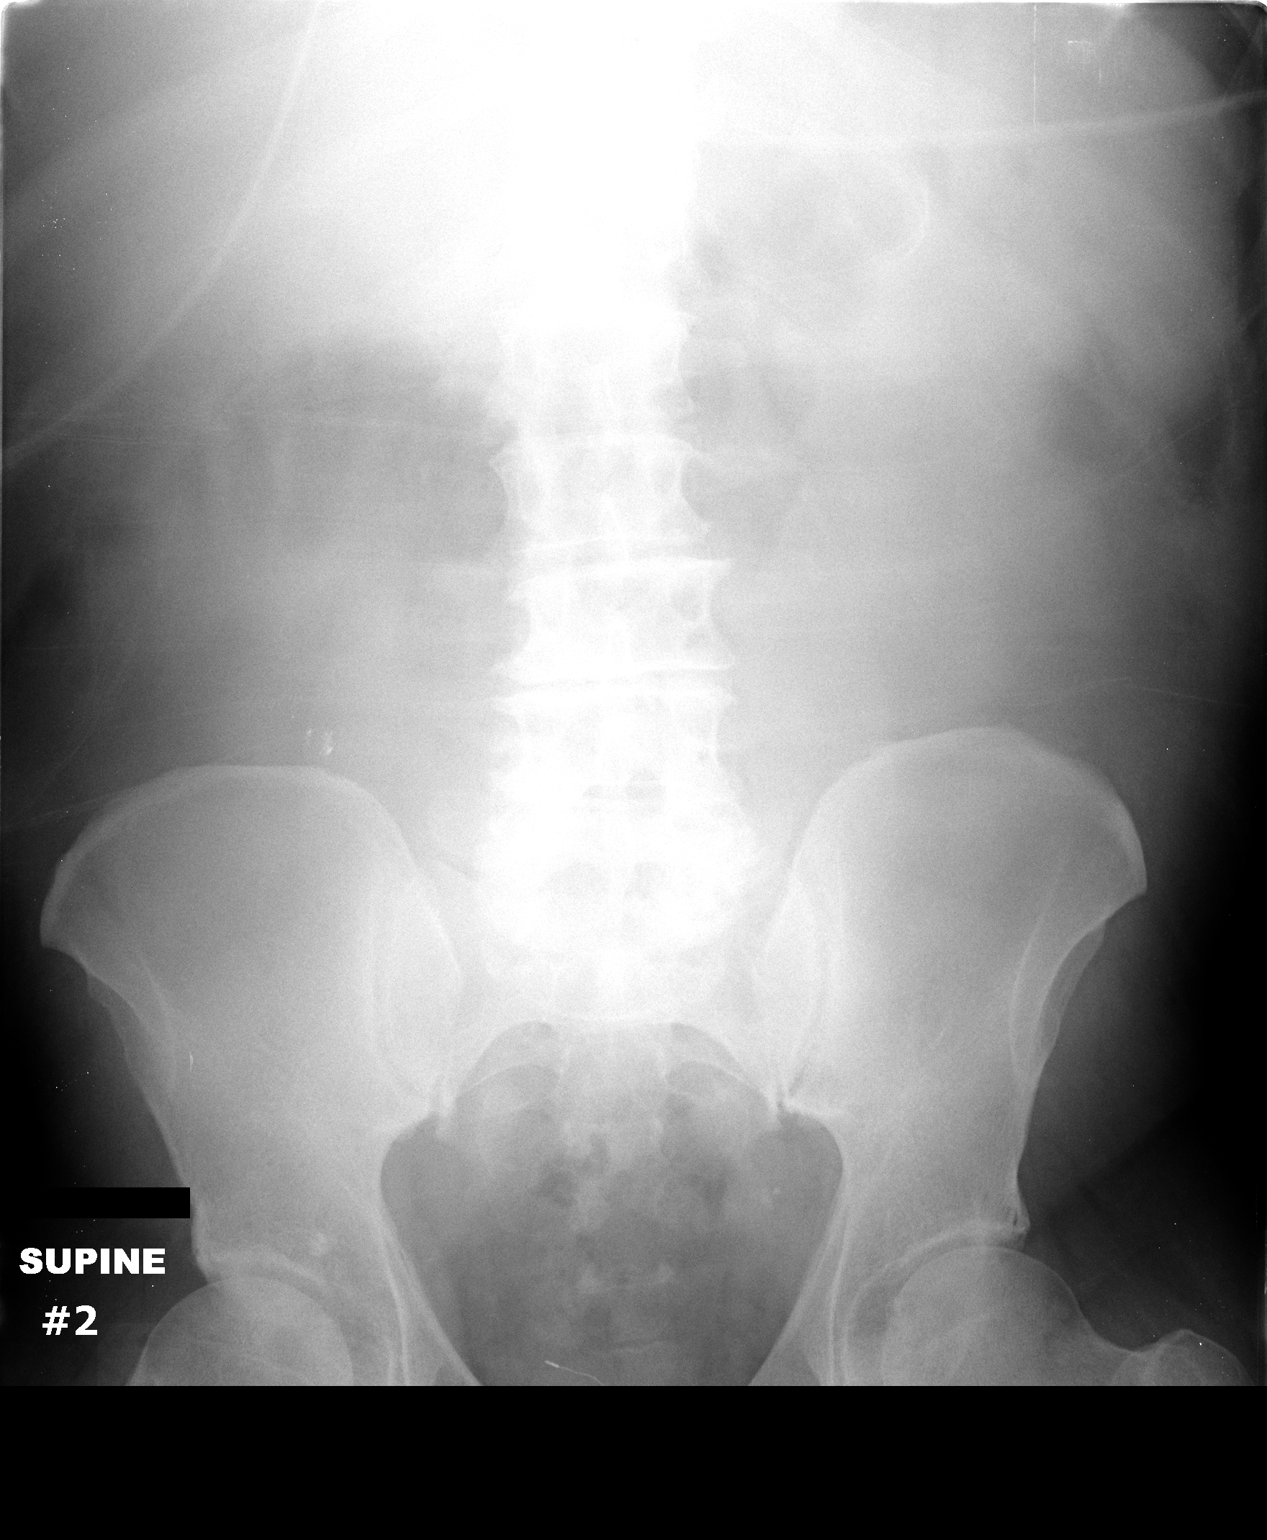

[2 of 2 positions shown; findings below may reference images not displayed]

FINDINGS: Nasogastric tube has its tip in the region of the antrum.
There is some gas in small and large bowel but no dilated loops to
suggest ileus or obstruction.  No worrisome calcifications bony
findings.
IMPRESSION: Nasogastric tube well positioned.  Less intestinal gas than seen
yesterday.  Gas pattern within normal limits presently.

## 2011-02-16 IMAGING — CR DG CHEST 1V PORT
1 series · 1 of 1 positions shown · non-contrast
Comparison: 10/02/2009.

CLINICAL DATA: Cardiac arrest.

PORTABLE CHEST - 1 VIEW

[AP]
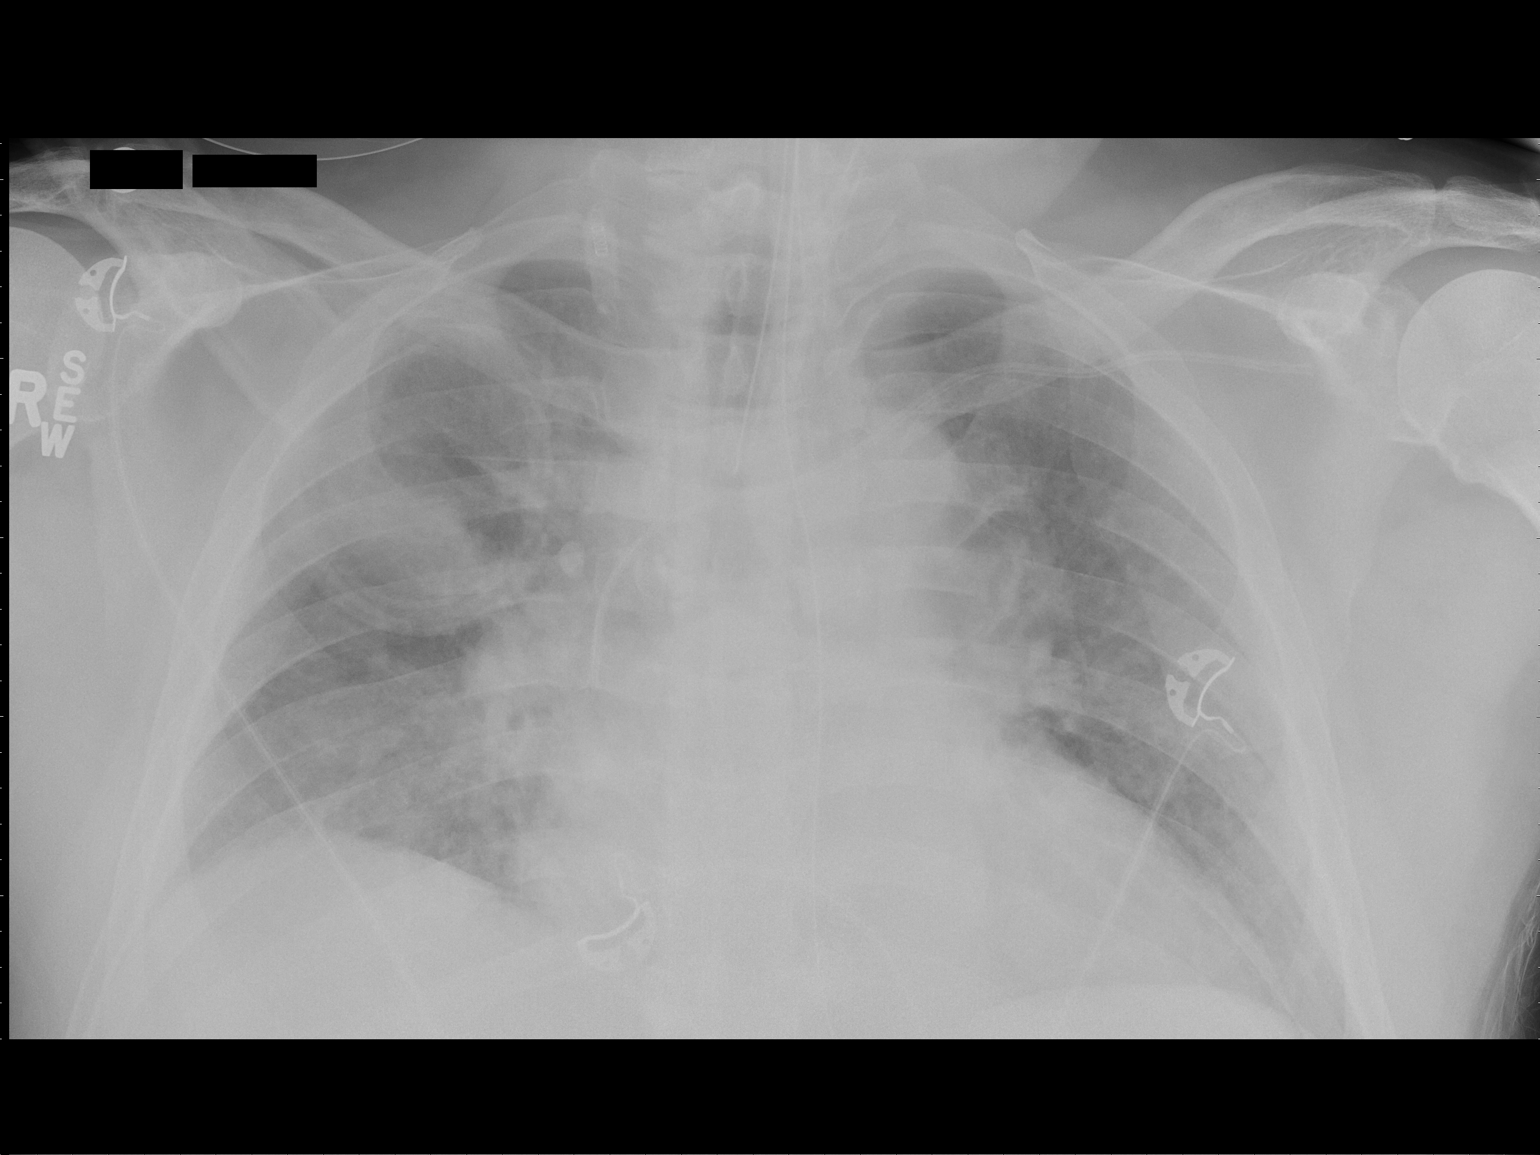

[1 of 1 positions shown; findings below may reference images not displayed]

FINDINGS: Low volume lungs.  Cardiomegaly.  Pulmonary vascular
congestion.  Focal density in the right upper lobe compatible with
atelectasis.  ET tube position satisfactory.  Central venous
catheter is in the mid to lower SVC.  NG tube is appreciated.
IMPRESSION: Cardiomegaly and vascular congestion.  New subsegmental atelectasis
right mid to upper lung zone.

## 2011-02-18 IMAGING — CR DG CHEST 1V PORT
1 series · 1 of 1 positions shown · non-contrast
Comparison: Portable chest x-ray 10/03/2009 and dating back to
09/29/2009.

CLINICAL DATA: Cardiorespiratory arrest with ventilator dependent
respiratory failure.  Hypothermia.

PORTABLE CHEST - 1 VIEW [DATE]/8211 1812 hours:

[AP]
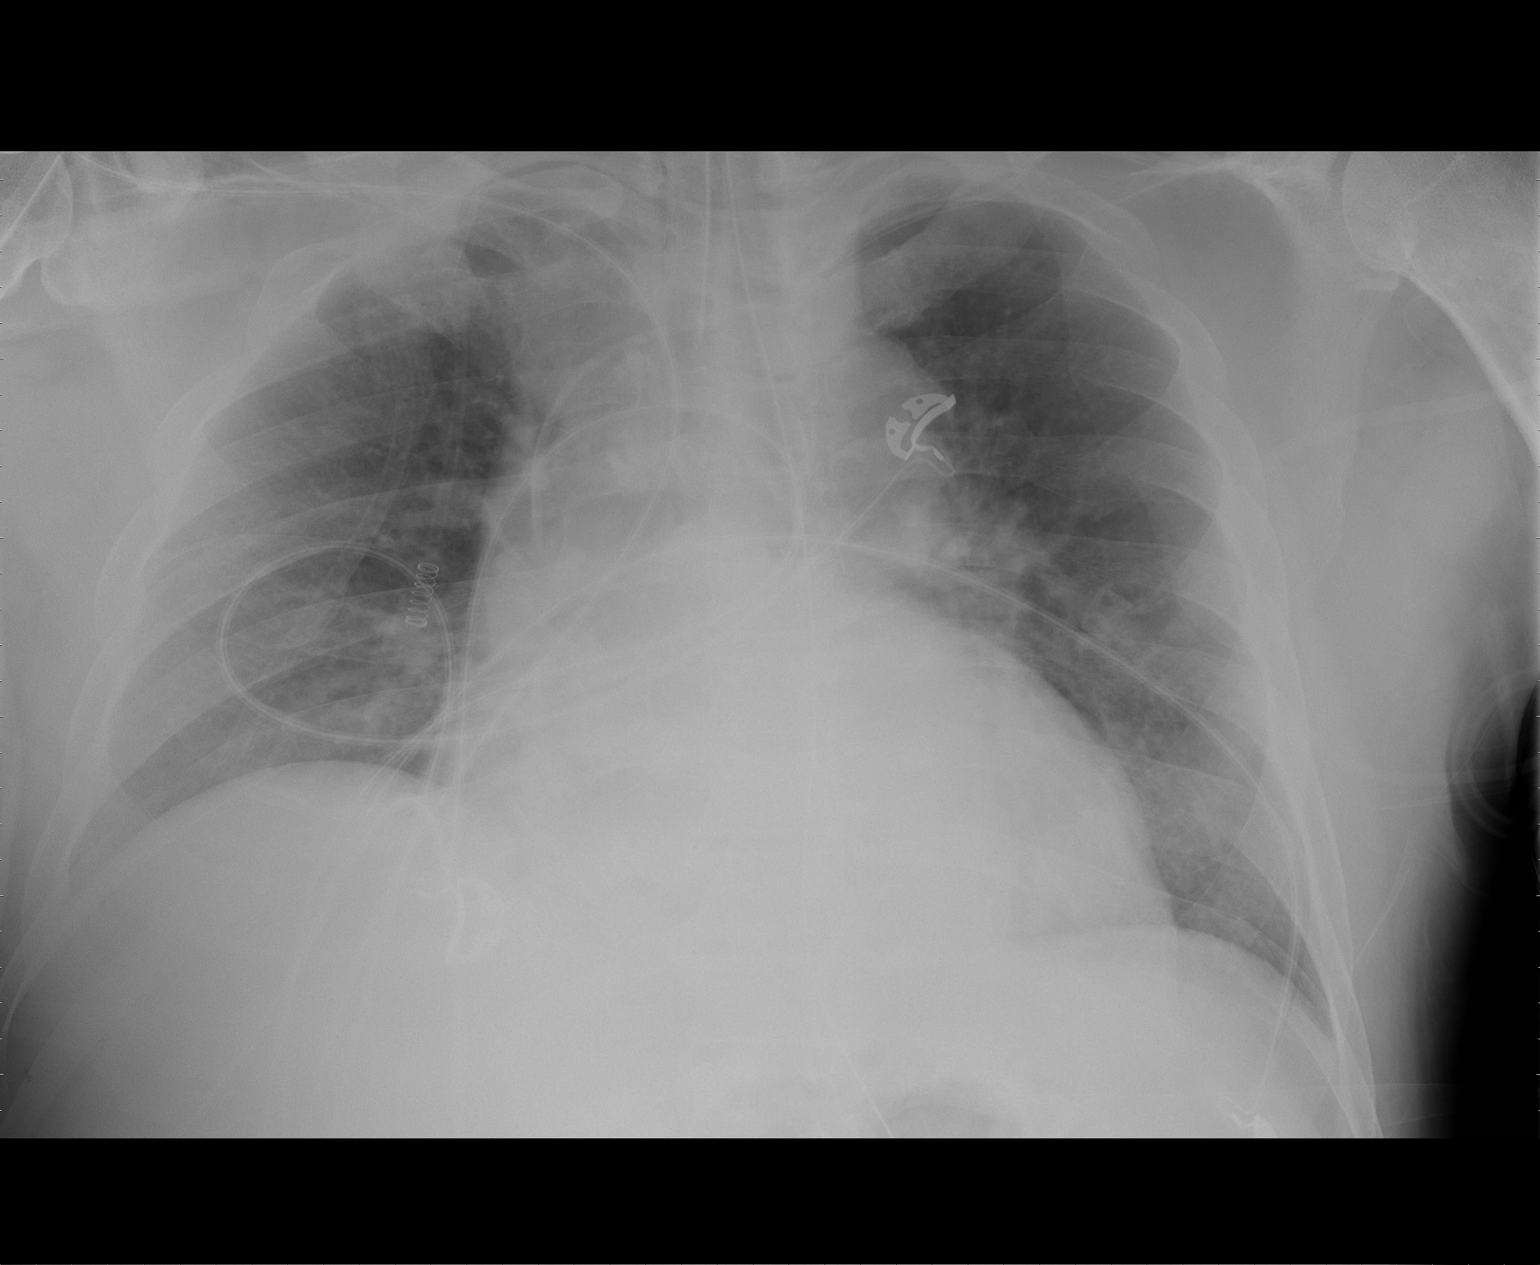

[1 of 1 positions shown; findings below may reference images not displayed]

FINDINGS: Endotracheal tube tip in satisfactory position
approximately 4 cm above the carina.  Nasogastric tube courses
below the diaphragm into the stomach.  Left subclavian central
venous catheter tip remains in the SVC.  Heart enlarged but stable.
Interval resolution of pulmonary venous hypertension and mild
interstitial edema.  Lungs now clear.  No visible pleural
effusions.
IMPRESSION: Support apparatus satisfactory.  Stable cardiomegaly.  Resolution
of pulmonary venous hypertension and interstitial edema since the
examination 2 days ago.  No acute cardiopulmonary disease
currently.

## 2011-03-14 ENCOUNTER — Other Ambulatory Visit: Payer: Self-pay | Admitting: *Deleted

## 2011-03-14 MED ORDER — CARVEDILOL 6.25 MG PO TABS: 6.2500 mg | ORAL_TABLET | Freq: Two times a day (BID) | ORAL | Status: DC

## 2011-03-14 MED ORDER — FUROSEMIDE 20 MG PO TABS: 20.0000 mg | ORAL_TABLET | Freq: Every day | ORAL | Status: DC

## 2011-09-10 ENCOUNTER — Telehealth: Payer: Self-pay | Admitting: Internal Medicine

## 2011-09-10 DIAGNOSIS — R002 Palpitations: Secondary | ICD-10-CM

## 2011-09-10 NOTE — Telephone Encounter (Signed)
PT CALLED C/O PALPITATIONS  2 EPISODES IN 1 MONTH  LASTING ONLY LONG ENOUGH TO TAKE A DEEP BREATH THEN GOES AWAY NO OTHER SYMPTOMS.HAS APPT  ON  10-25-11 AT 2:00 PM WITH DR Graciela Husbands  PT AWARE WILL FORWARD TO DR Graciela Husbands FOR REVIEW AND IF NEEDS EARLIER APPT ./ Zack Seal

## 2011-09-10 NOTE — Telephone Encounter (Signed)
Pt is having heart fludders and wants to be seen sooner

## 2011-09-12 NOTE — Telephone Encounter (Signed)
plz arrange for event recoreder   And move up appt to about 4 weeks  thx

## 2011-09-12 NOTE — Telephone Encounter (Signed)
PT AWARE  ORDER FOR EVENT MONITOR ENTERED AND APPT MOVED UP TO  10-18-11  AT 2:30 .Robert York

## 2011-09-14 ENCOUNTER — Telehealth: Payer: Self-pay | Admitting: *Deleted

## 2011-09-14 NOTE — Telephone Encounter (Signed)
Patient is scheduled for event monitor on 09/25/11 @ 10:45

## 2011-09-25 ENCOUNTER — Encounter (INDEPENDENT_AMBULATORY_CARE_PROVIDER_SITE_OTHER): Payer: BC Managed Care – PPO

## 2011-09-25 DIAGNOSIS — R002 Palpitations: Secondary | ICD-10-CM

## 2011-10-08 ENCOUNTER — Other Ambulatory Visit: Payer: Self-pay

## 2011-10-08 MED ORDER — CARVEDILOL 6.25 MG PO TABS: 6.2500 mg | ORAL_TABLET | Freq: Two times a day (BID) | ORAL | Status: DC

## 2011-10-18 ENCOUNTER — Ambulatory Visit: Payer: BC Managed Care – PPO | Admitting: Internal Medicine

## 2011-10-25 ENCOUNTER — Ambulatory Visit: Payer: BC Managed Care – PPO | Admitting: Internal Medicine

## 2011-10-29 ENCOUNTER — Other Ambulatory Visit: Payer: Self-pay

## 2011-10-29 MED ORDER — POTASSIUM CHLORIDE CRYS ER 20 MEQ PO TBCR: 20.0000 meq | EXTENDED_RELEASE_TABLET | Freq: Every day | ORAL | Status: DC

## 2011-11-12 ENCOUNTER — Other Ambulatory Visit: Payer: Self-pay | Admitting: Internal Medicine

## 2011-11-12 MED ORDER — FUROSEMIDE 20 MG PO TABS: 20.0000 mg | ORAL_TABLET | Freq: Every day | ORAL | Status: DC

## 2011-11-19 ENCOUNTER — Ambulatory Visit: Payer: BC Managed Care – PPO | Admitting: Internal Medicine

## 2011-12-10 ENCOUNTER — Other Ambulatory Visit: Payer: Self-pay | Admitting: Internal Medicine

## 2011-12-10 MED ORDER — FUROSEMIDE 20 MG PO TABS: 20.0000 mg | ORAL_TABLET | Freq: Every day | ORAL | Status: DC

## 2011-12-27 ENCOUNTER — Ambulatory Visit (INDEPENDENT_AMBULATORY_CARE_PROVIDER_SITE_OTHER): Payer: BC Managed Care – PPO | Admitting: Internal Medicine

## 2011-12-27 ENCOUNTER — Encounter: Payer: Self-pay | Admitting: Internal Medicine

## 2011-12-27 VITALS — BP 132/85 | HR 60 | Ht 71.0 in | Wt 258.0 lb

## 2011-12-27 DIAGNOSIS — I10 Essential (primary) hypertension: Secondary | ICD-10-CM

## 2011-12-27 DIAGNOSIS — R079 Chest pain, unspecified: Secondary | ICD-10-CM

## 2011-12-27 DIAGNOSIS — I428 Other cardiomyopathies: Secondary | ICD-10-CM

## 2011-12-27 DIAGNOSIS — I469 Cardiac arrest, cause unspecified: Secondary | ICD-10-CM

## 2011-12-27 NOTE — Progress Notes (Signed)
HPI  Robert York is a 61 y.o. male Seen in followup for cardiac arrest in the setting of hypokalemia and alcohol. He has been sober now for 13 months. He has normalization of left ventricular systolic function.   He is actively involved in AA. It is now 2 years plus of sobriety. He is doing really quite well. It would. Some episodes of fluttering. He was given an event recorder which failed to demonstrate anything. We reviewed that today as he did not show up in March.  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 There is no accompanying chest pain. He had a negative catheterization 2011  Past Medical History  Diagnosis Date  . Hyperlipidemia   . Hypertension   . Allergic rhinitis due to pollen   . Ventricular fibrillation 2011    leading to cardiac arrest  . Respiratory distress, acute   . Drug withdrawal     alcohol withdrawal  . Hypernatremia   . Propionibacterium infection 09/29/2009    1 set of blood culture   . MRSA (methicillin resistant staphylococcus aureus) pneumonia   . Thrombocytopenia   . DVT (deep venous thrombosis)     left calf and left greater saphenous vein   . Coronary artery disease   . COPD (chronic obstructive pulmonary disease)   . Alcohol dependence     history of  . Anxiety disorder     Past Surgical History  Procedure Date  . Back surgery   . Tonsillectomy   . Hernia repair     Current Outpatient Prescriptions  Medication Sig Dispense Refill  . ARIPiprazole (ABILIFY) 5 MG tablet Take 5 mg by mouth daily.        . Azelastine HCl (ASTEPRO) 0.15 % SOLN 1 spray by Nasal route 2 (two) times daily as needed.        . carvedilol (COREG) 6.25 MG tablet Take 1 tablet (6.25 mg total) by mouth 2 (two) times daily with a meal.  60 tablet  2  . ergocalciferol (VITAMIN D2) 50000 UNITS capsule Take 50,000 Units by mouth once a week.        . felodipine (PLENDIL) 5 MG 24  hr tablet Take 5 mg by mouth daily.        . fish oil-omega-3 fatty acids 1000 MG capsule Take 4 g by mouth daily.       . furosemide (LASIX) 20 MG tablet Take 1 tablet (20 mg total) by mouth daily.  30 tablet  0  . levalbuterol (XOPENEX HFA) 45 MCG/ACT inhaler Inhale 1-2 puffs into the lungs every 4 (four) hours as needed.        . magnesium oxide (MAG-OX) 400 MG tablet Take 400 mg by mouth daily.        . montelukast (SINGULAIR) 10 MG tablet Take 10 mg by mouth at bedtime.        [name] omeprazole (PRILOSEC) 20 MG capsule Take 20 mg by mouth 2 (two) times daily.        . potassium chloride SA (K-DUR,KLOR-CON) 20 MEQ tablet Take 1 tablet (20 mEq total) by mouth daily.  30 tablet  1  . sertraline (ZOLOFT) 50 MG tablet Take 50 mg by mouth daily.        . Thiamine HCl (VITAMIN B-1) 100 MG tablet Take 100 mg by mouth daily.        [name]  valsartan-hydrochlorothiazide (DIOVAN-HCT) 80-12.5 MG per tablet Take 1 tablet by mouth daily.        [name] zolpidem (AMBIEN) 10 MG tablet Take 5 mg by mouth at bedtime as needed.         Allergies  Allergen Reactions  . Amoxicillin     REACTION: rash  . Lisinopril     Review of Systems negative except from HPI and PMH  Physical Exam BP 132/85  Pulse 60  Ht 5\' 11"  (1.803 m)  Wt 258 lb (117.028 kg)  BMI 35.98 kg/m2 Well developed and well nourished in no acute distress HENT normal E scleral and icterus clear Neck Supple JVP flat; carotids brisk and full Clear to ausculation Regular rate and rhythm, no murmurs gallops or rub Soft with active bowel sounds No clubbing cyanosis none Edema Alert and oriented, grossly normal motor and sensory function Skin Warm and Dry    Assessment and  Plan

## 2011-12-27 NOTE — Patient Instructions (Signed)
Your physician has requested that you have a lexiscan myoview. For further information please visit [email or web address] Please follow instruction sheet, as given.  Your physician wants you to follow-up in: 1 year with Dr. Graciela Husbands. You will receive a reminder letter in the mail two months in advance. If you don't receive a letter, please call our office to schedule the follow-up appointment.

## 2011-12-27 NOTE — Assessment & Plan Note (Signed)
Blood pressure is stable 

## 2011-12-27 NOTE — Assessment & Plan Note (Signed)
The patient had palpitations without significant symptoms in the winter r. A monitor was utilized; however, he would not wear it when he had his symptoms. The last few months have been quiet in terms of arrhythmia

## 2011-12-27 NOTE — Assessment & Plan Note (Signed)
Results cardiomyopathy without recurrent arrhythmia.

## 2011-12-27 NOTE — Assessment & Plan Note (Signed)
The patient developed significant chest pain while working out in the form last week. It was associated with profound fear diaphoresis and weakness. He is unable to angulate because of prior damage to his foot and not withstanding his normal electrocardiogram, will undertake Myoview scanning to exclude significant coronary disease or intercurrent infarction.

## 2012-01-16 ENCOUNTER — Ambulatory Visit (HOSPITAL_COMMUNITY): Payer: BC Managed Care – PPO | Attending: Internal Medicine | Admitting: Radiology

## 2012-01-16 VITALS — BP 112/68 | Ht 71.0 in | Wt 255.0 lb

## 2012-01-16 DIAGNOSIS — Z87891 Personal history of nicotine dependence: Secondary | ICD-10-CM | POA: Insufficient documentation

## 2012-01-16 DIAGNOSIS — I440 Atrioventricular block, first degree: Secondary | ICD-10-CM

## 2012-01-16 DIAGNOSIS — J449 Chronic obstructive pulmonary disease, unspecified: Secondary | ICD-10-CM | POA: Insufficient documentation

## 2012-01-16 DIAGNOSIS — I10 Essential (primary) hypertension: Secondary | ICD-10-CM | POA: Insufficient documentation

## 2012-01-16 DIAGNOSIS — J4489 Other specified chronic obstructive pulmonary disease: Secondary | ICD-10-CM | POA: Insufficient documentation

## 2012-01-16 DIAGNOSIS — R0602 Shortness of breath: Secondary | ICD-10-CM | POA: Insufficient documentation

## 2012-01-16 DIAGNOSIS — R002 Palpitations: Secondary | ICD-10-CM | POA: Insufficient documentation

## 2012-01-16 DIAGNOSIS — R079 Chest pain, unspecified: Secondary | ICD-10-CM

## 2012-01-16 DIAGNOSIS — E785 Hyperlipidemia, unspecified: Secondary | ICD-10-CM | POA: Insufficient documentation

## 2012-01-16 DIAGNOSIS — R5381 Other malaise: Secondary | ICD-10-CM | POA: Insufficient documentation

## 2012-01-16 DIAGNOSIS — I4949 Other premature depolarization: Secondary | ICD-10-CM

## 2012-01-16 DIAGNOSIS — I428 Other cardiomyopathies: Secondary | ICD-10-CM | POA: Insufficient documentation

## 2012-01-16 DIAGNOSIS — R61 Generalized hyperhidrosis: Secondary | ICD-10-CM | POA: Insufficient documentation

## 2012-01-16 MED ORDER — TECHNETIUM TC 99M TETROFOSMIN IV KIT
33.0000 | PACK | Freq: Once | INTRAVENOUS | Status: AC | PRN
  Administered 2012-01-16: 33 via INTRAVENOUS

## 2012-01-16 MED ORDER — TECHNETIUM TC 99M TETROFOSMIN IV KIT
11.0000 | PACK | Freq: Once | INTRAVENOUS | Status: AC | PRN
  Administered 2012-01-16: 11 via INTRAVENOUS

## 2012-01-16 MED ORDER — REGADENOSON 0.4 MG/5ML IV SOLN
0.4000 mg | Freq: Once | INTRAVENOUS | Status: AC
  Administered 2012-01-16: 0.4 mg via INTRAVENOUS

## 2012-01-16 NOTE — Progress Notes (Signed)
[facility] SITE 3 NUCLEAR MED [address] [place] [address] [phone]  Cardiology Nuclear Med Study  KYLEN SCHLIEP is a 61 y.o. male     MRN : [id number]     DOB: January 04, 1951  Procedure Date: 01/16/2012  Nuclear Med Background Indication for Stress Test:  Evaluation for Ischemia History:  COPD, '01MPS:abnormal with apical thinning,septal ischemia,EF= 48%;3/11 VF insetting of hypokalemia and ETOH>Heart Catheterization:EF=40%,nonischemic cardiomyopathy and normal coronaries;4/11 Cardiomyopathy:nonischemic;5/11 Echo:EF=55% Cardiac Risk Factors: History of Smoking, Hypertension and Lipids  Symptoms:  Diaphoresis, Fatigue, Palpitations and SOB   Nuclear Pre-Procedure Caffeine/Decaff Intake:  None > 12 hrs NPO After: 10:00pm   Lungs:  clear O2 Sat: 93% on RA IV 0.9% NS with Angio Cath:  22g  IV Site: R Hand x 1, tolerated well IV Started by:  Irean Hong, RN  Chest Size (in):  46 Cup Size: n/a  Height: 5\' 11"  (1.803 m)  Weight:  255 lb (115.667 kg)  BMI:  Body mass index is 35.57 kg/(m^2). Tech Comments:  Held coreg this am    Nuclear Med Study 1 or 2 day study: 1 day  Stress Test Type:  Treadmill/Lexiscan  Reading MD: Arvilla Meres, MD  Order Authorizing Provider:  Sherryl Manges, MD  Resting Radionuclide: Technetium 88m Tetrofosmin  Resting Radionuclide Dose: 11.0 mCi   Stress Radionuclide:  Technetium 8m Tetrofosmin  Stress Radionuclide Dose: 33.0 mCi           Stress Protocol Rest HR: 59 Stress HR: 85  Rest BP: 112/68 Stress BP: 138/67  Exercise Time (min): 2:00 METS: 1.8   Predicted Max HR: 158 bpm % Max HR: 53.8 bpm Rate Pressure Product: 27253   Dose of Adenosine (mg):  n/a Dose of Lexiscan: 0.4 mg  Dose of Atropine (mg): n/a Dose of Dobutamine: n/a mcg/kg/min (at max HR)  Stress Test Technologist: Cathlyn Parsons, RN  Nuclear Technologist:  Doyne Keel, CNMT     Rest Procedure:  Myocardial perfusion imaging was performed at rest 45  minutes following the intravenous administration of Technetium 31m Tetrofosmin. Rest ECG: Sinus Bradycardia with first degree AV block  Stress Procedure:  The patient received IV Lexiscan 0.4 mg over 15-seconds with concurrent low level exercise and then Technetium 48m Tetrofosmin was injected at 30-seconds while the patient continued walking one more minute. There were no significant changes with Lexiscan. Patient had a rare PVC. Quantitative spect images were obtained after a 45-minute delay. Stress ECG: No significant change from baseline ECG  QPS Raw Data Images:  Normal; no motion artifact; normal heart/lung ratio. Stress Images:  Normal homogeneous uptake in all areas of the myocardium. Rest Images:  Normal homogeneous uptake in all areas of the myocardium. Subtraction (SDS):  Normal Transient Ischemic Dilatation (Normal <1.22):  1.03 Lung/Heart Ratio (Normal <0.45):  0.43  Quantitative Gated Spect Images QGS EDV:  178 ml QGS ESV:  94 ml  LV Ejection Fraction: 47%.  LV Wall Motion:  Wall motion looks grossly normal. EF visually appears better than 47%. Consider 2-D echo to further evaluate.  Impression Exercise Capacity:  Lexiscan with no exercise. BP Response:  n/a Clinical Symptoms:  n/a ECG Impression:  No significant ECG changes with Lexiscan. Comparison with Prior Nuclear Study: No images to compare  Overall Impression:  Normal stress nuclear study. EF visually appears better than 47%. Consider 2-D echo to further evaluate.     Radek, Carnero 6:52 PM

## 2012-01-28 ENCOUNTER — Telehealth: Payer: Self-pay | Admitting: *Deleted

## 2012-01-28 DIAGNOSIS — I429 Cardiomyopathy, unspecified: Secondary | ICD-10-CM

## 2012-01-28 NOTE — Telephone Encounter (Signed)
I spoke with the patient and made him aware of his myoview results. Per Dr. Graciela Husbands, he would like to order a repeat echo to reassess his LV function since he is around 47 % on his myoview. The patient is agreeable. I will order this and forward to scheduling to contact the patient.

## 2012-02-01 ENCOUNTER — Other Ambulatory Visit: Payer: Self-pay | Admitting: *Deleted

## 2012-02-01 MED ORDER — CARVEDILOL 6.25 MG PO TABS: 6.2500 mg | ORAL_TABLET | Freq: Two times a day (BID) | ORAL | Status: DC

## 2012-02-01 MED ORDER — POTASSIUM CHLORIDE CRYS ER 20 MEQ PO TBCR: 20.0000 meq | EXTENDED_RELEASE_TABLET | Freq: Every day | ORAL | Status: DC

## 2012-02-01 NOTE — Telephone Encounter (Signed)
Reordered meds

## 2012-02-04 ENCOUNTER — Other Ambulatory Visit: Payer: Self-pay | Admitting: Cardiology

## 2012-02-04 ENCOUNTER — Ambulatory Visit (HOSPITAL_COMMUNITY): Payer: BC Managed Care – PPO | Attending: Cardiology | Admitting: Radiology

## 2012-02-04 ENCOUNTER — Encounter: Payer: Self-pay | Admitting: Internal Medicine

## 2012-02-04 DIAGNOSIS — Z86718 Personal history of other venous thrombosis and embolism: Secondary | ICD-10-CM | POA: Insufficient documentation

## 2012-02-04 DIAGNOSIS — I429 Cardiomyopathy, unspecified: Secondary | ICD-10-CM

## 2012-02-04 DIAGNOSIS — I517 Cardiomegaly: Secondary | ICD-10-CM | POA: Insufficient documentation

## 2012-02-04 DIAGNOSIS — I4901 Ventricular fibrillation: Secondary | ICD-10-CM | POA: Insufficient documentation

## 2012-02-04 DIAGNOSIS — I10 Essential (primary) hypertension: Secondary | ICD-10-CM | POA: Insufficient documentation

## 2012-02-04 DIAGNOSIS — Z8674 Personal history of sudden cardiac arrest: Secondary | ICD-10-CM | POA: Insufficient documentation

## 2012-02-04 DIAGNOSIS — J449 Chronic obstructive pulmonary disease, unspecified: Secondary | ICD-10-CM | POA: Insufficient documentation

## 2012-02-04 DIAGNOSIS — I7789 Other specified disorders of arteries and arterioles: Secondary | ICD-10-CM

## 2012-02-04 DIAGNOSIS — E669 Obesity, unspecified: Secondary | ICD-10-CM | POA: Insufficient documentation

## 2012-02-04 DIAGNOSIS — R9430 Abnormal result of cardiovascular function study, unspecified: Secondary | ICD-10-CM

## 2012-02-04 DIAGNOSIS — I2510 Atherosclerotic heart disease of native coronary artery without angina pectoris: Secondary | ICD-10-CM | POA: Insufficient documentation

## 2012-02-04 DIAGNOSIS — I426 Alcoholic cardiomyopathy: Secondary | ICD-10-CM

## 2012-02-04 DIAGNOSIS — J4489 Other specified chronic obstructive pulmonary disease: Secondary | ICD-10-CM | POA: Insufficient documentation

## 2012-02-04 HISTORY — DX: Other specified disorders of arteries and arterioles: I77.89

## 2012-02-04 NOTE — Progress Notes (Signed)
Echocardiogram performed.  

## 2012-02-11 ENCOUNTER — Telehealth: Payer: Self-pay | Admitting: *Deleted

## 2012-02-11 MED ORDER — CARVEDILOL 12.5 MG PO TABS: 12.5000 mg | ORAL_TABLET | Freq: Two times a day (BID) | ORAL | Status: DC

## 2012-02-11 NOTE — Telephone Encounter (Signed)
PT AWARE OF ECHO RESULTS AND TO INCREASE COREG TO 12.5 MG BID  .Zack Seal

## 2013-01-02 ENCOUNTER — Ambulatory Visit (INDEPENDENT_AMBULATORY_CARE_PROVIDER_SITE_OTHER): Payer: BC Managed Care – PPO | Admitting: Internal Medicine

## 2013-01-02 ENCOUNTER — Encounter: Payer: Self-pay | Admitting: Internal Medicine

## 2013-01-02 VITALS — BP 102/72 | HR 59 | Ht 71.0 in | Wt 257.0 lb

## 2013-01-02 DIAGNOSIS — G478 Other sleep disorders: Secondary | ICD-10-CM

## 2013-01-02 DIAGNOSIS — G2581 Restless legs syndrome: Secondary | ICD-10-CM | POA: Insufficient documentation

## 2013-01-02 DIAGNOSIS — G4730 Sleep apnea, unspecified: Secondary | ICD-10-CM

## 2013-01-02 DIAGNOSIS — E876 Hypokalemia: Secondary | ICD-10-CM

## 2013-01-02 DIAGNOSIS — I469 Cardiac arrest, cause unspecified: Secondary | ICD-10-CM

## 2013-01-02 DIAGNOSIS — I10 Essential (primary) hypertension: Secondary | ICD-10-CM

## 2013-01-02 DIAGNOSIS — I7789 Other specified disorders of arteries and arterioles: Secondary | ICD-10-CM

## 2013-01-02 MED ORDER — ARIPIPRAZOLE 2 MG PO TABS: 5.0000 mg | ORAL_TABLET | Freq: Every day | ORAL | Status: DC

## 2013-01-02 NOTE — Assessment & Plan Note (Signed)
Will reassess echo next year at his office visit for aortic root dimension

## 2013-01-02 NOTE — Assessment & Plan Note (Signed)
No recurrent arrhythmia. 

## 2013-01-02 NOTE — Patient Instructions (Addendum)
Your physician wants you to follow-up in: SEE DR Graciela Husbands IN  A YEAR ECHO SAME DAY FOR AORTIC ROOT   You will receive a reminder letter in the mail two months in advance. If you don't receive a letter, please call our office to schedule the follow-up appointment.  Your physician has recommended you make the following change in your medication: STOP POTASSIUM AND  FUROSEMIDE    Your physician recommends that you return for lab work in: BMET IN  3 WEEKS   DX   V58.69 Your physician has recommended that you have a sleep study. This test records several body functions during sleep, including: brain activity, eye movement, oxygen and carbon dioxide blood levels, heart rate and rhythm, breathing rate and rhythm, the flow of air through your mouth and nose, snoring, body muscle movements, and chest and belly movement.

## 2013-01-02 NOTE — Assessment & Plan Note (Signed)
We'll undertake a sleep study. He has daytime somnolence obstructive breathing i  hypertension abdomen obesity

## 2013-01-02 NOTE — Assessment & Plan Note (Signed)
Well controlled. There has been no edema. We will discontinue the Lasix and potassium. He also needs a sleep study

## 2013-01-02 NOTE — Assessment & Plan Note (Signed)
We'll discontinue the potassium with his Lasix. We'll recheck a metabolic profile in 3 weeks

## 2013-01-02 NOTE — Progress Notes (Signed)
Patient Care Team: Georgann Housekeeper, MD as PCP - General (Internal Medicine)   HPI  Robert York is a 62 y.o. male Seen in followup for cardiac arrest in the setting of hypokalemia and alcohol.   He has normalization of left ventricular systolic function.    He is actively involved in AA.he has been sober since 2011.[name] He is doing really quite well.he is not exercising. He started smoking again.\\    He snores. He stops breathing at night according to his wife. There is no accompanying chest pain. He had a negative catheterization 2011   Past Medical History  Diagnosis Date  . Hyperlipidemia   . Hypertension   . Allergic rhinitis due to pollen   . Ventricular fibrillation 2011    leading to cardiac arrest  . Respiratory distress, acute   . Drug withdrawal     alcohol withdrawal  . Hypernatremia   . Propionibacterium infection 09/29/2009    1 set of blood culture   . MRSA (methicillin resistant staphylococcus aureus) pneumonia   . Thrombocytopenia   . DVT (deep venous thrombosis)     left calf and left greater saphenous vein   . Coronary artery disease   . COPD (chronic obstructive pulmonary disease)   . Alcohol dependence     history of  . Anxiety disorder   . Aortic root enlargement 02/04/2012    Echo 2013 46 mm 2012 45 mm     Past Surgical History  Procedure Laterality Date  . Back surgery    . Tonsillectomy    . Hernia repair      Current Outpatient Prescriptions  Medication Sig Dispense Refill  . ARIPiprazole (ABILIFY) 2 MG tablet Take 2.5 tablets (5 mg total) by mouth daily.      . Azelastine HCl (ASTEPRO) 0.15 % SOLN 1 spray by Nasal route 2 (two) times daily as needed.        . carvedilol (COREG) 12.5 MG tablet Take 1 tablet (12.5 mg total) by mouth 2 (two) times daily with a meal.  60 tablet  11  . felodipine (PLENDIL) 5 MG 24 hr tablet Take 5 mg by mouth daily.        . fish oil-omega-3 fatty acids 1000 MG capsule Take 4 g by mouth daily.       . furosemide  (LASIX) 20 MG tablet Take 1 tablet (20 mg total) by mouth daily.  30 tablet  0  . levalbuterol (XOPENEX HFA) 45 MCG/ACT inhaler Inhale 1-2 puffs into the lungs every 4 (four) hours as needed.        . magnesium oxide (MAG-OX) 400 MG tablet Take 400 mg by mouth daily.        [name] omeprazole (PRILOSEC) 20 MG capsule Take 20 mg by mouth 2 (two) times daily.        . potassium chloride SA (K-DUR,KLOR-CON) 20 MEQ tablet Take 1 tablet (20 mEq total) by mouth daily.  30 tablet  11  . sertraline (ZOLOFT) 50 MG tablet Take 50 mg by mouth daily.        . Thiamine HCl (VITAMIN B-1) 100 MG tablet Take 100 mg by mouth daily.        . valsartan-hydrochlorothiazide (DIOVAN-HCT) 80-12.5 MG per tablet Take 1 tablet by mouth daily.        [name] zolpidem (AMBIEN) 10 MG tablet Take 5 mg by mouth at bedtime as needed.        No current facility-administered medications for this  visit.    Allergies  Allergen Reactions  . Amoxicillin     REACTION: rash  . Lisinopril     Review of Systems negative except from HPI and PMH  Physical Exam BP 102/72  Pulse 59  Ht 5\' 11"  (1.803 m)  Wt 257 lb (116.574 kg)  BMI 35.86 kg/m2 Well developed and nourished in no acute distress HENT normal Neck supple with JVP-flat Clear Regular rate and rhythm, no murmurs or gallops Abd-soft with active BS profoud obesity No Clubbing cyanosis edema Skin-warm and dry A & Oriented  Grossly normal sensory and motor function   Electrocardiogram demonstrates sinus rhythm at 59 Intervals 23/11/40  Assessment and  Plan

## 2013-01-21 ENCOUNTER — Other Ambulatory Visit: Payer: BC Managed Care – PPO

## 2013-01-28 ENCOUNTER — Other Ambulatory Visit: Payer: Self-pay | Admitting: *Deleted

## 2013-01-28 MED ORDER — CARVEDILOL 12.5 MG PO TABS: 12.5000 mg | ORAL_TABLET | Freq: Two times a day (BID) | ORAL | Status: DC

## 2013-05-11 ENCOUNTER — Inpatient Hospital Stay (HOSPITAL_COMMUNITY)
Admission: EM | Admit: 2013-05-11 | Discharge: 2013-05-15 | DRG: 189 | Disposition: A | Discharge status: Home / Self Care | Payer: BC Managed Care – PPO | Attending: Internal Medicine | Admitting: Internal Medicine

## 2013-05-11 ENCOUNTER — Emergency Department (HOSPITAL_COMMUNITY): Payer: BC Managed Care – PPO

## 2013-05-11 ENCOUNTER — Encounter (HOSPITAL_COMMUNITY): Payer: Self-pay | Admitting: Emergency Medicine

## 2013-05-11 DIAGNOSIS — F17200 Nicotine dependence, unspecified, uncomplicated: Secondary | ICD-10-CM | POA: Diagnosis present

## 2013-05-11 DIAGNOSIS — D72829 Elevated white blood cell count, unspecified: Secondary | ICD-10-CM

## 2013-05-11 DIAGNOSIS — F411 Generalized anxiety disorder: Secondary | ICD-10-CM | POA: Diagnosis present

## 2013-05-11 DIAGNOSIS — F329 Major depressive disorder, single episode, unspecified: Secondary | ICD-10-CM | POA: Diagnosis present

## 2013-05-11 DIAGNOSIS — R059 Cough, unspecified: Secondary | ICD-10-CM | POA: Diagnosis present

## 2013-05-11 DIAGNOSIS — R05 Cough: Secondary | ICD-10-CM | POA: Diagnosis present

## 2013-05-11 DIAGNOSIS — J441 Chronic obstructive pulmonary disease with (acute) exacerbation: Secondary | ICD-10-CM

## 2013-05-11 DIAGNOSIS — E785 Hyperlipidemia, unspecified: Secondary | ICD-10-CM | POA: Diagnosis present

## 2013-05-11 DIAGNOSIS — J9600 Acute respiratory failure, unspecified whether with hypoxia or hypercapnia: Secondary | ICD-10-CM

## 2013-05-11 DIAGNOSIS — G4700 Insomnia, unspecified: Secondary | ICD-10-CM | POA: Diagnosis present

## 2013-05-11 DIAGNOSIS — J9690 Respiratory failure, unspecified, unspecified whether with hypoxia or hypercapnia: Secondary | ICD-10-CM

## 2013-05-11 DIAGNOSIS — F3289 Other specified depressive episodes: Secondary | ICD-10-CM | POA: Diagnosis present

## 2013-05-11 DIAGNOSIS — R0603 Acute respiratory distress: Secondary | ICD-10-CM

## 2013-05-11 DIAGNOSIS — Z7982 Long term (current) use of aspirin: Secondary | ICD-10-CM

## 2013-05-11 DIAGNOSIS — J189 Pneumonia, unspecified organism: Secondary | ICD-10-CM | POA: Diagnosis present

## 2013-05-11 DIAGNOSIS — G4733 Obstructive sleep apnea (adult) (pediatric): Secondary | ICD-10-CM | POA: Diagnosis present

## 2013-05-11 DIAGNOSIS — J9601 Acute respiratory failure with hypoxia: Secondary | ICD-10-CM

## 2013-05-11 DIAGNOSIS — I5022 Chronic systolic (congestive) heart failure: Secondary | ICD-10-CM

## 2013-05-11 DIAGNOSIS — I509 Heart failure, unspecified: Secondary | ICD-10-CM

## 2013-05-11 DIAGNOSIS — I498 Other specified cardiac arrhythmias: Secondary | ICD-10-CM | POA: Diagnosis not present

## 2013-05-11 DIAGNOSIS — Z8674 Personal history of sudden cardiac arrest: Secondary | ICD-10-CM

## 2013-05-11 DIAGNOSIS — I5031 Acute diastolic (congestive) heart failure: Secondary | ICD-10-CM

## 2013-05-11 DIAGNOSIS — J301 Allergic rhinitis due to pollen: Secondary | ICD-10-CM | POA: Diagnosis present

## 2013-05-11 DIAGNOSIS — Z720 Tobacco use: Secondary | ICD-10-CM

## 2013-05-11 DIAGNOSIS — J9620 Acute and chronic respiratory failure, unspecified whether with hypoxia or hypercapnia: Principal | ICD-10-CM | POA: Diagnosis present

## 2013-05-11 DIAGNOSIS — I10 Essential (primary) hypertension: Secondary | ICD-10-CM

## 2013-05-11 DIAGNOSIS — G2581 Restless legs syndrome: Secondary | ICD-10-CM | POA: Diagnosis present

## 2013-05-11 DIAGNOSIS — Z86718 Personal history of other venous thrombosis and embolism: Secondary | ICD-10-CM

## 2013-05-11 DIAGNOSIS — Z79899 Other long term (current) drug therapy: Secondary | ICD-10-CM

## 2013-05-11 DIAGNOSIS — G4730 Sleep apnea, unspecified: Secondary | ICD-10-CM

## 2013-05-11 LAB — CBC WITH DIFFERENTIAL/PLATELET
Basophils Absolute: 0 10*3/uL (ref 0.0–0.1)
Basophils Relative: 0 % (ref 0–1)
Eosinophils Absolute: 0.1 10*3/uL (ref 0.0–0.7)
Hemoglobin: 15.4 g/dL (ref 13.0–17.0)
Lymphs Abs: 1.8 10*3/uL (ref 0.7–4.0)
MCHC: 34.5 g/dL (ref 30.0–36.0)
MCV: 86.3 fL (ref 78.0–100.0)
Neutro Abs: 13.5 10*3/uL — ABNORMAL HIGH (ref 1.7–7.7)
Neutrophils Relative %: 81 % — ABNORMAL HIGH (ref 43–77)
RDW: 13.5 % (ref 11.5–15.5)
WBC: 16.6 10*3/uL — ABNORMAL HIGH (ref 4.0–10.5)

## 2013-05-11 LAB — POCT I-STAT TROPONIN I: Troponin i, poc: 0.02 ng/mL (ref 0.00–0.08)

## 2013-05-11 LAB — COMPREHENSIVE METABOLIC PANEL
ALT: 24 U/L (ref 0–53)
AST: 39 U/L — ABNORMAL HIGH (ref 0–37)
Albumin: 3.3 g/dL — ABNORMAL LOW (ref 3.5–5.2)
Alkaline Phosphatase: 97 U/L (ref 39–117)
Calcium: 8.9 mg/dL (ref 8.4–10.5)
Chloride: 102 mEq/L (ref 96–112)
Potassium: 4.2 mEq/L (ref 3.5–5.1)
Total Bilirubin: 0.6 mg/dL (ref 0.3–1.2)

## 2013-05-11 LAB — URINALYSIS, ROUTINE W REFLEX MICROSCOPIC
Hgb urine dipstick: NEGATIVE
Ketones, ur: NEGATIVE mg/dL
Leukocytes, UA: NEGATIVE
Nitrite: NEGATIVE
Specific Gravity, Urine: 1.024 (ref 1.005–1.030)
Urobilinogen, UA: 1 mg/dL (ref 0.0–1.0)
pH: 5.5 (ref 5.0–8.0)

## 2013-05-11 LAB — POCT I-STAT 3, ART BLOOD GAS (G3+)
Acid-base deficit: 1 mmol/L (ref 0.0–2.0)
Bicarbonate: 23.4 mEq/L (ref 20.0–24.0)
Patient temperature: 98.6
TCO2: 25 mmol/L (ref 0–100)
pH, Arterial: 7.389 (ref 7.350–7.450)

## 2013-05-11 LAB — TROPONIN I: Troponin I: <0.3 ng/mL (ref <0.30)

## 2013-05-11 MED ORDER — SODIUM CHLORIDE 0.9 % IJ SOLN: 3.0000 mL | Freq: Two times a day (BID) | INTRAMUSCULAR | Status: DC

## 2013-05-11 MED ORDER — PANTOPRAZOLE SODIUM 40 MG PO TBEC: 40.0000 mg | DELAYED_RELEASE_TABLET | Freq: Every day | ORAL | Status: DC

## 2013-05-11 MED ORDER — SODIUM CHLORIDE 0.9 % IJ SOLN
3.0000 mL | Freq: Two times a day (BID) | INTRAMUSCULAR | Status: DC
  Administered 2013-05-11 – 2013-05-14 (×7): 3 mL via INTRAVENOUS

## 2013-05-11 MED ORDER — SODIUM CHLORIDE 0.9 % IV SOLN: 250.0000 mL | INTRAVENOUS | Status: DC | PRN

## 2013-05-11 MED ORDER — CARVEDILOL 12.5 MG PO TABS
12.5000 mg | ORAL_TABLET | Freq: Two times a day (BID) | ORAL | Status: DC
  Administered 2013-05-12 (×2): 12.5 mg via ORAL
  Filled 2013-05-11 (×5): qty 1

## 2013-05-11 MED ORDER — ONDANSETRON HCL 4 MG/2ML IJ SOLN: 4.0000 mg | Freq: Four times a day (QID) | INTRAMUSCULAR | Status: DC | PRN

## 2013-05-11 MED ORDER — IPRATROPIUM BROMIDE 0.02 % IN SOLN
0.5000 mg | Freq: Four times a day (QID) | RESPIRATORY_TRACT | Status: DC
  Administered 2013-05-11 – 2013-05-14 (×10): 0.5 mg via RESPIRATORY_TRACT
  Filled 2013-05-11 (×10): qty 2.5

## 2013-05-11 MED ORDER — SERTRALINE HCL 100 MG PO TABS
100.0000 mg | ORAL_TABLET | Freq: Every morning | ORAL | Status: DC
  Administered 2013-05-12 – 2013-05-15 (×4): 100 mg via ORAL
  Filled 2013-05-11 (×4): qty 1

## 2013-05-11 MED ORDER — ALBUTEROL SULFATE (5 MG/ML) 0.5% IN NEBU
2.5000 mg | INHALATION_SOLUTION | RESPIRATORY_TRACT | Status: DC | PRN
  Administered 2013-05-14 (×2): 2.5 mg via RESPIRATORY_TRACT
  Filled 2013-05-11 (×3): qty 0.5

## 2013-05-11 MED ORDER — IPRATROPIUM BROMIDE 0.02 % IN SOLN
0.5000 mg | Freq: Once | RESPIRATORY_TRACT | Status: AC
  Administered 2013-05-11: 0.5 mg via RESPIRATORY_TRACT
  Filled 2013-05-11: qty 2.5

## 2013-05-11 MED ORDER — SODIUM CHLORIDE 0.9 % IJ SOLN: 3.0000 mL | INTRAMUSCULAR | Status: DC | PRN

## 2013-05-11 MED ORDER — LEVOFLOXACIN IN D5W 750 MG/150ML IV SOLN
750.0000 mg | INTRAVENOUS | Status: DC
  Administered 2013-05-11 – 2013-05-13 (×3): 750 mg via INTRAVENOUS
  Filled 2013-05-11 (×4): qty 150

## 2013-05-11 MED ORDER — ALBUTEROL SULFATE (5 MG/ML) 0.5% IN NEBU
5.0000 mg | INHALATION_SOLUTION | Freq: Once | RESPIRATORY_TRACT | Status: AC
  Administered 2013-05-11: 5 mg via RESPIRATORY_TRACT
  Filled 2013-05-11: qty 1

## 2013-05-11 MED ORDER — ALBUTEROL SULFATE (5 MG/ML) 0.5% IN NEBU
2.5000 mg | INHALATION_SOLUTION | Freq: Four times a day (QID) | RESPIRATORY_TRACT | Status: DC
  Administered 2013-05-11 – 2013-05-14 (×10): 2.5 mg via RESPIRATORY_TRACT
  Filled 2013-05-11 (×9): qty 0.5

## 2013-05-11 MED ORDER — METHYLPREDNISOLONE SODIUM SUCC 125 MG IJ SOLR
60.0000 mg | Freq: Four times a day (QID) | INTRAMUSCULAR | Status: DC
  Administered 2013-05-11 – 2013-05-13 (×6): 60 mg via INTRAVENOUS
  Filled 2013-05-11 (×10): qty 0.96

## 2013-05-11 MED ORDER — ONDANSETRON HCL 4 MG PO TABS: 4.0000 mg | ORAL_TABLET | Freq: Four times a day (QID) | ORAL | Status: DC | PRN

## 2013-05-11 MED ORDER — PANTOPRAZOLE SODIUM 40 MG IV SOLR
40.0000 mg | INTRAVENOUS | Status: DC
  Administered 2013-05-11 – 2013-05-13 (×3): 40 mg via INTRAVENOUS
  Filled 2013-05-11 (×4): qty 40

## 2013-05-11 MED ORDER — ENOXAPARIN SODIUM 40 MG/0.4ML ~~LOC~~ SOLN
40.0000 mg | SUBCUTANEOUS | Status: DC
  Administered 2013-05-11 – 2013-05-14 (×4): 40 mg via SUBCUTANEOUS
  Filled 2013-05-11 (×5): qty 0.4

## 2013-05-11 MED ORDER — FUROSEMIDE 10 MG/ML IJ SOLN
40.0000 mg | Freq: Once | INTRAMUSCULAR | Status: AC
  Administered 2013-05-11: 40 mg via INTRAVENOUS
  Filled 2013-05-11: qty 4

## 2013-05-11 MED ORDER — ARIPIPRAZOLE 2 MG PO TABS
2.0000 mg | ORAL_TABLET | Freq: Every morning | ORAL | Status: DC
  Administered 2013-05-12 – 2013-05-15 (×4): 2 mg via ORAL
  Filled 2013-05-11 (×4): qty 1

## 2013-05-11 MED ORDER — ASPIRIN EC 81 MG PO TBEC
81.0000 mg | DELAYED_RELEASE_TABLET | Freq: Every day | ORAL | Status: DC
  Administered 2013-05-12 – 2013-05-15 (×4): 81 mg via ORAL
  Filled 2013-05-11 (×4): qty 1

## 2013-05-11 MED ORDER — IRBESARTAN 75 MG PO TABS
75.0000 mg | ORAL_TABLET | Freq: Every day | ORAL | Status: DC
  Filled 2013-05-11: qty 1

## 2013-05-11 MED ORDER — METHYLPREDNISOLONE SODIUM SUCC 125 MG IJ SOLR
125.0000 mg | Freq: Once | INTRAMUSCULAR | Status: AC
  Administered 2013-05-11: 125 mg via INTRAVENOUS
  Filled 2013-05-11: qty 2

## 2013-05-11 MED ORDER — FELODIPINE ER 5 MG PO TB24
5.0000 mg | ORAL_TABLET | Freq: Two times a day (BID) | ORAL | Status: DC
  Filled 2013-05-11 (×3): qty 1

## 2013-05-11 NOTE — Consult Note (Signed)
CONSULT NOTE  Date: 05/11/2013               Patient Name:  Robert York MRN: [id number]  DOB: [birth year] Age / Sex: 62 y.o., male        PCP: Georgann Housekeeper Primary Cardiologist: Berton Mount, MD            Referring Physician: Gwyneth Sprout, MD              Reason for Consult: Shortness of breath           History of Present Illness: Patient is a 62 y.o. male with a PMHx of HTN, COPD, cardiac arrest , who was admitted to [facility] on 05/11/2013 for evaluation of severe dyspnea with O2 sat of 60s.    He was out on a fishing trip.  Had progressive weakness, dyspnea.  He had severe lethergy - too weak to even get up.   + cough - yellow , green.  He has been around several people who have URIs.   He has not been eating any extra salt.   He is on coreg and diovan and has been taking his meds.   He was brought to ED by EMS on bipap.  His O2 sats are 93% on BIPAP.    Medications: Outpatient medications:  (Not in a hospital admission)  Current medications: No current facility-administered medications for this encounter.   Current Outpatient Prescriptions  Medication Sig Dispense Refill  . albuterol (PROVENTIL HFA;VENTOLIN HFA) 108 (90 BASE) MCG/ACT inhaler Inhale 2 puffs into the lungs every 6 (six) hours as needed for wheezing.      . ARIPiprazole (ABILIFY) 2 MG tablet Take 2 mg by mouth every morning.      . Ascorbic Acid (VITAMIN C) 1000 MG tablet Take 1,000 mg by mouth daily.      [name] aspirin EC 81 MG tablet Take 81 mg by mouth daily.      . carvedilol (COREG) 12.5 MG tablet Take 1 tablet (12.5 mg total) by mouth 2 (two) times daily with a meal.  60 tablet  11  . DM-Doxylamine-Acetaminophen (NYQUIL COLD & FLU PO) Take 30 mLs by mouth daily as needed (for cold).      . felodipine (PLENDIL) 5 MG 24 hr tablet Take 5 mg by mouth 2 (two) times daily.       . fish oil-omega-3 fatty acids 1000 MG capsule Take 2 g by mouth 2 (two) times daily.       [name] ibuprofen (ADVIL,MOTRIN) 200  MG tablet Take 400 mg by mouth 2 (two) times daily.      [name] MAGNESIUM PO Take 1 tablet by mouth every evening.      . naproxen sodium (ANAPROX) 220 MG tablet Take 440 mg by mouth daily as needed (for pain).      . NIACIN PO Take 1 tablet by mouth 2 (two) times daily.       [name] omeprazole (PRILOSEC) 20 MG capsule Take 20 mg by mouth daily.       . Pseudoephedrine-APAP-DM (DAYQUIL MULTI-SYMPTOM COLD/FLU PO) Take 2 capsules by mouth daily as needed (for cold).      . sertraline (ZOLOFT) 100 MG tablet Take 100 mg by mouth every morning.      . Thiamine HCl (VITAMIN B-1) 100 MG tablet Take 100 mg by mouth daily.        . valsartan (DIOVAN) 80 MG tablet Take 80 mg by mouth daily.      [name]  zolpidem (AMBIEN) 10 MG tablet Take 5 mg by mouth at bedtime as needed.          Allergies  Allergen Reactions  . Lisinopril Other (See Comments)    Makes COPD worse, rare reaction-per patient.    . Amoxicillin Itching and Rash     Past Medical History  Diagnosis Date  . Hyperlipidemia   . Hypertension   . Allergic rhinitis due to pollen   . Ventricular fibrillation 2011    leading to cardiac arrest  . Respiratory distress, acute   . Hypernatremia   . MRSA (methicillin resistant staphylococcus aureus) pneumonia   . Thrombocytopenia   . DVT (deep venous thrombosis)     left calf and left greater saphenous vein   . COPD (chronic obstructive pulmonary disease)   . Alcohol dependence     history of  . Anxiety disorder   . Aortic root enlargement 02/04/2012    Echo 2013 46 mm 2012 45 mm    *note:  CAD was originally listed in his history but in fact, his cardiac cath was normal.  I have deleted CAD from his PMH.      Past Surgical History  Procedure Laterality Date  . Back surgery    . Tonsillectomy    . Hernia repair      Family History  Problem Relation Age of Onset  . Allergies Paternal Grandfather   . Asthma Father   . Heart disease Paternal Grandfather   . Breast cancer Mother     Social  History:  reports that he quit smoking about 7 years ago. His smoking use included Cigarettes. He has a 60 pack-year smoking history. He has never used smokeless tobacco. He reports that he drinks alcohol. He reports that he does not use illicit drugs.   Review of Systems: Constitutional:  denies fever, chills, diaphoresis, appetite change and fatigue.  HEENT: denies photophobia, eye pain, redness, hearing loss, ear pain, congestion, sore throat, rhinorrhea, sneezing, neck pain, neck stiffness and tinnitus.  Respiratory: admits to SOB, DOE, cough, chest tightness, and wheezing. + yellow sputum.  Cardiovascular: denies chest pain, palpitations and leg swelling.  Gastrointestinal: denies nausea, vomiting, abdominal pain, diarrhea, constipation, blood in stool.  Genitourinary: denies dysuria, urgency, frequency, hematuria, flank pain and difficulty urinating.  Musculoskeletal: denies  myalgias, back pain, joint swelling, arthralgias and gait problem.   Skin: denies pallor, rash and wound.  Neurological: denies dizziness, seizures, syncope, weakness, light-headedness, numbness and headaches.   Hematological: denies adenopathy, easy bruising, personal or family bleeding history.  Psychiatric/ Behavioral: denies suicidal ideation, mood changes, confusion, nervousness, sleep disturbance and agitation.    Physical Exam: BP 110/59  Pulse 68  Temp(Src) 98.2 F (36.8 C) (Axillary)  Resp 26  SpO2 94%  General: Vital signs reviewed and noted. Well-developed,moderately obese, examinon on BiPAP  Head: Normocephalic, atraumatic, sclera anicteric, mucus membranes are moist  Neck: Supple. Negative for carotid bruits. JVD difficult to assess  Lungs:  Bilateral wheezes  Heart: RRR with S1 S2. No murmurs, rubs, or gallops    Abdomen:  Soft, non-tender, moderately obese  MSK: Strength and the appear normal for age.  Extremities: No clubbing or cyanosis. No edema.   + chronic stasis changes  Neurologic:  Alert and oriented X 3. Moves all extremities spontaneously.  Psych: Responds to questions appropriately with a normal affect.    Lab results: Basic Metabolic Panel:  Recent Labs Lab 05/11/13 1154  NA 139  K 4.2  CL 102  CO2 24  GLUCOSE 127*  BUN 22  CREATININE 0.88  CALCIUM 8.9    Liver Function Tests:  Recent Labs Lab 05/11/13 1154  AST 39*  ALT 24  ALKPHOS 97  BILITOT 0.6  PROT 7.8  ALBUMIN 3.3*   No results found for this basename: LIPASE, AMYLASE,  in the last 168 hours No results found for this basename: AMMONIA,  in the last 168 hours  CBC:  Recent Labs Lab 05/11/13 1154  WBC 16.6*  NEUTROABS 13.5*  HGB 15.4  HCT 44.6  MCV 86.3  PLT 279    Cardiac Enzymes: No results found for this basename: CKTOTAL, CKMB, CKMBINDEX, TROPONINI,  in the last 168 hours  BNP: No components found with this basename: POCBNP,   CBG: No results found for this basename: GLUCAP,  in the last 168 hours  Coagulation Studies: No results found for this basename: LABPROT, INR,  in the last 72 hours   Other results:  EKG:.   NSR, no ST or T wave changes.       Imaging: Dg Chest Port 1 View  05/11/2013   CLINICAL DATA:  Short of breath  EXAM: PORTABLE CHEST - 1 VIEW  COMPARISON:  Radiograph 10/12/2009  FINDINGS: Cardiac silhouette is enlarged. Exam is lordotic. There is bibasilar airspace disease suggesting pulmonary edema. Low lung volumes. No pneumothorax.  IMPRESSION: Cardiomegaly and bibasilar airspace disease suggesting pulmonary edema. Lung bases are poorly evaluated.   Electronically Signed   By: Genevive Bi M.D.   On: 05/11/2013 13:04     Last Echo   Left ventricle: The cavity size was normal. Wall thickness was normal. Systolic function was mildly reduced. The estimated ejection fraction was in the range of 45% to 50%. Diffuse hypokinesis. Features are consistent with a pseudonormal left ventricular filling pattern, with concomitant abnormal  relaxation and increased filling pressure (grade 2 diastolic dysfunction). - Aortic valve: There was no stenosis. - Aorta: Dilated aortic root. Aortic root dimension: 46mm (ED). - Mitral valve: No significant regurgitation. - Left atrium: The atrium was mildly dilated. - Right ventricle: The cavity size was normal. Systolic function was normal. - Tricuspid valve: Peak RV-RA gradient: 21mm Hg (S). - Pulmonary arteries: PA peak pressure: 26mm Hg    Assessment & Plan:  1. Dyspnea:  His symptoms sound more like COPD / pneumonia ( cough, + yellow sputum, elevated white count).  His symptoms of generalized weakness and lethergy are c/w CO2 narcosis.  We do not have an ABG before BIPAP but the current ABG on BIPAP is normal.  He still has significant wheezing.  The CXR does show bilateral edema.  He does not look significantly volume overloaded.   I agree with another dose of lasix.  I dont think this is primaryily a volume overload issue.  I think his pulmonary infiltrates are secondary.  We should get an echo.    Would check cardiac enzymes though I doubt this is flash pulmonary edema caused by an acute coronary syndrome.   His cardiac cath was normal in 2011.     I think he will need to be set up with Pulmonary at some point.    2. Obstructive sleep apnea:  He has apneic episodes according to his wife.  He will need a sleep study at some point   3. HTN:  BP is normal   Vesta Mixer, Montez Hageman., MD, [facility] 05/11/2013, 3:31 PM

## 2013-05-11 NOTE — ED Notes (Signed)
@  1200 pt arrived by EMS on CPAP. Pt was then placed on BIPAP. RT attempted to take pt off NIV and place on [place] per MD order. Pt desat to 72%, RT then attempted to place on NRB, pt SPo2 85% so RT placed back on  BIPAP with previous settings. RT will continue to monitor.

## 2013-05-11 NOTE — H&P (Addendum)
[facility] History and Physical  Robert York WUJ:[id number] DOB: 17-Sep-1950 DOA: 05/11/2013  Referring physician: er PCP: Georgann Housekeeper, MD  Specialists: cardiology  Chief Complaint: cough/sob  HPI: Robert York is a 61 y.o. male  Who has been to the beach on a fishing trip. His roommate had a URI and patient then developed cough-productive of sputum (yellowish) and chills.  No calf pain. Has been smoking- up to yesterday  When EMS was called to his house, his O2 sat was 60% on RA.  He improved with bipap and albuterol/atrovent and had improvement into the 80s and now upper 90's.  ABG was done and was re-assuring  He has a history of cardiac arrest after low K from alcohol abuse- denies alcohol use now.  Cardiology has seen in the ER  Patient currently on Bipap, able to talk- wanting to eat   Review of Systems: all systems reviewed, negative unless stated above    Past Medical History  Diagnosis Date  . Hyperlipidemia   . Hypertension   . Allergic rhinitis due to pollen   . Ventricular fibrillation 2011    leading to cardiac arrest  . Respiratory distress, acute   . Hypernatremia   . MRSA (methicillin resistant staphylococcus aureus) pneumonia   . Thrombocytopenia   . DVT (deep venous thrombosis)     left calf and left greater saphenous vein   . COPD (chronic obstructive pulmonary disease)   . Alcohol dependence     history of  . Anxiety disorder   . Aortic root enlargement 02/04/2012    Echo 2013 46 mm 2012 45 mm    Past Surgical History  Procedure Laterality Date  . Back surgery    . Tonsillectomy    . Hernia repair     Social History:  reports that he quit smoking about 7 years ago. His smoking use included Cigarettes. He has a 60 pack-year smoking history. He has never used smokeless tobacco. He reports that he drinks alcohol. He reports that he does not use illicit drugs.- wife states quit smoking yesterday- has been smoking   Allergies  Allergen  Reactions  . Lisinopril Other (See Comments)    Makes COPD worse, rare reaction-per patient.    . Amoxicillin Itching and Rash    Family History  Problem Relation Age of Onset  . Allergies Paternal Grandfather   . Asthma Father   . Heart disease Paternal Grandfather   . Breast cancer Mother      Prior to Admission medications   Medication Sig Start Date End Date Taking? Authorizing Provider  albuterol (PROVENTIL HFA;VENTOLIN HFA) 108 (90 BASE) MCG/ACT inhaler Inhale 2 puffs into the lungs every 6 (six) hours as needed for wheezing.   Yes Historical Provider, MD  ARIPiprazole (ABILIFY) 2 MG tablet Take 2 mg by mouth every morning.   Yes Historical Provider, MD  Ascorbic Acid (VITAMIN C) 1000 MG tablet Take 1,000 mg by mouth daily.   Yes Historical Provider, MD  aspirin EC 81 MG tablet Take 81 mg by mouth daily.   Yes Historical Provider, MD  carvedilol (COREG) 12.5 MG tablet Take 1 tablet (12.5 mg total) by mouth 2 (two) times daily with a meal. 01/28/13  Yes Duke Salvia, MD  DM-Doxylamine-Acetaminophen (NYQUIL COLD & FLU PO) Take 30 mLs by mouth daily as needed (for cold).   Yes Historical Provider, MD  felodipine (PLENDIL) 5 MG 24 hr tablet Take 5 mg by mouth 2 (two) times daily.  Yes Historical Provider, MD  fish oil-omega-3 fatty acids 1000 MG capsule Take 2 g by mouth 2 (two) times daily.    Yes Historical Provider, MD  ibuprofen (ADVIL,MOTRIN) 200 MG tablet Take 400 mg by mouth 2 (two) times daily.   Yes Historical Provider, MD  MAGNESIUM PO Take 1 tablet by mouth every evening.   Yes Historical Provider, MD  naproxen sodium (ANAPROX) 220 MG tablet Take 440 mg by mouth daily as needed (for pain).   Yes Historical Provider, MD  NIACIN PO Take 1 tablet by mouth 2 (two) times daily.    Yes Historical Provider, MD  omeprazole (PRILOSEC) 20 MG capsule Take 20 mg by mouth daily.    Yes Historical Provider, MD  Pseudoephedrine-APAP-DM (DAYQUIL MULTI-SYMPTOM COLD/FLU PO) Take 2 capsules  by mouth daily as needed (for cold).   Yes Historical Provider, MD  sertraline (ZOLOFT) 100 MG tablet Take 100 mg by mouth every morning.   Yes Historical Provider, MD  Thiamine HCl (VITAMIN B-1) 100 MG tablet Take 100 mg by mouth daily.     Yes Historical Provider, MD  valsartan (DIOVAN) 80 MG tablet Take 80 mg by mouth daily.   Yes Historical Provider, MD  zolpidem (AMBIEN) 10 MG tablet Take 5 mg by mouth at bedtime as needed.    Yes Historical Provider, MD   Physical Exam: Filed Vitals:   05/11/13 1530  BP: 109/62  Pulse: 65  Temp:   Resp: 28     General:  On bipap  Eyes: wnl  ENT: wnl  Neck: supple  Cardiovascular: regular  Respiratory: diminished, + wheezing  Abdomen: +Bs, soft, NT- obese  Skin: no rashes or lesions  Musculoskeletal: moves all 4 ext  Psychiatric: normal mood/affect  Neurologic: no focal deficit  Labs on Admission:  Basic Metabolic Panel:  Recent Labs Lab 05/11/13 1154  NA 139  K 4.2  CL 102  CO2 24  GLUCOSE 127*  BUN 22  CREATININE 0.88  CALCIUM 8.9   Liver Function Tests:  Recent Labs Lab 05/11/13 1154  AST 39*  ALT 24  ALKPHOS 97  BILITOT 0.6  PROT 7.8  ALBUMIN 3.3*   No results found for this basename: LIPASE, AMYLASE,  in the last 168 hours No results found for this basename: AMMONIA,  in the last 168 hours CBC:  Recent Labs Lab 05/11/13 1154  WBC 16.6*  NEUTROABS 13.5*  HGB 15.4  HCT 44.6  MCV 86.3  PLT 279   Cardiac Enzymes: No results found for this basename: CKTOTAL, CKMB, CKMBINDEX, TROPONINI,  in the last 168 hours  BNP (last 3 results)  Recent Labs  05/11/13 1154  PROBNP 1110.0*   CBG: No results found for this basename: GLUCAP,  in the last 168 hours  Radiological Exams on Admission: Dg Chest Port 1 View  05/11/2013   CLINICAL DATA:  Short of breath  EXAM: PORTABLE CHEST - 1 VIEW  COMPARISON:  Radiograph 10/12/2009  FINDINGS: Cardiac silhouette is enlarged. Exam is lordotic. There is  bibasilar airspace disease suggesting pulmonary edema. Low lung volumes. No pneumothorax.  IMPRESSION: Cardiomegaly and bibasilar airspace disease suggesting pulmonary edema. Lung bases are poorly evaluated.   Electronically Signed   By: Genevive Bi M.D.   On: 05/11/2013 13:04    EKG: Independently reviewed. sinus  Assessment/Plan Active Problems:   HYPERTENSION   Leukocytosis   Tobacco abuse   COPD exacerbation   1. Acute respiratory failure- due to COPD exacerbation- continue bipap, steroid, nebs,  abx 2. COPD exacerbation- see above 3. Leukocytosis- no sign of PNA on x ray- ? Viral as sick contact, check viral swab 4. ?pulm edema- cards to do gentle IV lasix, cycle CE 5. Tobacco abuse- encourage cessation 6. Will need outpatient OSA work up 7. HTN- continue home meds   cardiology -left message for Dr. Eula Listen  Code Status: full Family Communication: wife at bedside Disposition Plan: admit to SDU (on bipap)  Time spent: 70  Benjamine Mola [facility] Pager [phone]  If 7PM-7AM, please contact night-coverage [email or web address] Password [facility] 05/11/2013, 4:26 PM

## 2013-05-11 NOTE — Progress Notes (Signed)
RT removed pt from bipap.  Pt was place don 6lpm [place] but sats dropped.  Rt placed pt on venti at 50% and pt still never came up to 88%.  Rt placed pt on nrb at 100% and pt is resting better and sats are 93%.  RT and RN wil continue to monitor.

## 2013-05-11 NOTE — Progress Notes (Signed)
Patient transferred from ER via stretcher on Bipap with RN and RT at side. Patient not in distress at this time. Oriented to unit and room, instructed on callbell and placed at side. Patient's wife at bedside. Updated on POC, will continue to monitor.

## 2013-05-11 NOTE — ED Notes (Signed)
Admitting MD at the bedside.  

## 2013-05-11 NOTE — ED Notes (Signed)
Pt to department via EMS- pt reports some increased SOB over the past couple of days. EMS reports the fire had a sat of 50 on arrival. Increased to 84 on Cpap, 20 albuterol and 1 atrovent PTA. Pt with hx of asthma and cardiac arrest. Pt with diminished breath sounds, wheezing on the left side. Bp-122/80 Hr-86 20 left wrist. Pt A&Ox4 on arrival.

## 2013-05-11 NOTE — ED Notes (Signed)
Cardiology at the bedside.

## 2013-05-11 NOTE — ED Provider Notes (Addendum)
CSN: [id number]     Arrival date & time 05/11/13  1144 History   First MD Initiated Contact with Patient 05/11/13 1145     Chief Complaint  Patient presents with  . Shortness of Breath   (Consider location/radiation/quality/duration/timing/severity/associated sxs/prior Treatment) HPI Comments: When fire arrived sating 60% on RA.  With bipap and albuterol/atrovent improvement to the 80's   Patient is a 62 y.o. male presenting with shortness of breath. The history is provided by the patient and the EMS personnel.  Shortness of Breath Severity:  Severe Onset quality:  Gradual Duration:  4 days Timing:  Constant Progression:  Worsening Chronicity:  Recurrent Context comment:  Started after a fishing trip Relieved by:  Nothing Worsened by:  Activity Ineffective treatments:  Inhaler Associated symptoms: cough, sputum production and wheezing   Associated symptoms: no abdominal pain, no chest pain, no fever and no vomiting   Risk factors: no hx of PE/DVT, no recent surgery and no tobacco use     Past Medical History  Diagnosis Date  . Hyperlipidemia   . Hypertension   . Allergic rhinitis due to pollen   . Ventricular fibrillation 2011    leading to cardiac arrest  . Respiratory distress, acute   . Hypernatremia   . MRSA (methicillin resistant staphylococcus aureus) pneumonia   . Thrombocytopenia   . DVT (deep venous thrombosis)     left calf and left greater saphenous vein   . Coronary artery disease   . COPD (chronic obstructive pulmonary disease)   . Alcohol dependence     history of  . Anxiety disorder   . Aortic root enlargement 02/04/2012    Echo 2013 46 mm 2012 45 mm    Past Surgical History  Procedure Laterality Date  . Back surgery    . Tonsillectomy    . Hernia repair     Family History  Problem Relation Age of Onset  . Allergies Paternal Grandfather   . Asthma Father   . Heart disease Paternal Grandfather   . Breast cancer Mother    History  Substance  Use Topics  . Smoking status: Former Smoker -- 2.00 packs/day for 30 years    Types: Cigarettes    Quit date: 11/20/2005  . Smokeless tobacco: Never Used  . Alcohol Use: 0.0 oz/week    3-4 Glasses of wine per week    Review of Systems  Constitutional: Negative for fever.  Respiratory: Positive for cough, sputum production, shortness of breath and wheezing.   Cardiovascular: Negative for chest pain.  Gastrointestinal: Negative for vomiting and abdominal pain.  All other systems reviewed and are negative.    Allergies  Amoxicillin and Lisinopril  Home Medications   Current Outpatient Rx  Name  Route  Sig  Dispense  Refill  . ARIPiprazole (ABILIFY) 2 MG tablet   Oral   Take 2.5 tablets (5 mg total) by mouth daily.         . Azelastine HCl (ASTEPRO) 0.15 % SOLN   Nasal   1 spray by Nasal route 2 (two) times daily as needed.           . carvedilol (COREG) 12.5 MG tablet   Oral   Take 1 tablet (12.5 mg total) by mouth 2 (two) times daily with a meal.   60 tablet   11   . felodipine (PLENDIL) 5 MG 24 hr tablet   Oral   Take 5 mg by mouth daily.           [name]  fish oil-omega-3 fatty acids 1000 MG capsule   Oral   Take 4 g by mouth daily.          [name] levalbuterol (XOPENEX HFA) 45 MCG/ACT inhaler   Inhalation   Inhale 1-2 puffs into the lungs every 4 (four) hours as needed.           . magnesium oxide (MAG-OX) 400 MG tablet   Oral   Take 400 mg by mouth daily.           [name] omeprazole (PRILOSEC) 20 MG capsule   Oral   Take 20 mg by mouth 2 (two) times daily.           . sertraline (ZOLOFT) 50 MG tablet   Oral   Take 50 mg by mouth daily.           . Thiamine HCl (VITAMIN B-1) 100 MG tablet   Oral   Take 100 mg by mouth daily.           . valsartan-hydrochlorothiazide (DIOVAN-HCT) 80-12.5 MG per tablet   Oral   Take 1 tablet by mouth daily.           [name] zolpidem (AMBIEN) 10 MG tablet   Oral   Take 5 mg by mouth at bedtime as needed.            BP 92/58  Pulse 71  Temp(Src) 98.2 F (36.8 C) (Axillary)  Resp 24  SpO2 99% Physical Exam  Nursing note and vitals reviewed. Constitutional: He is oriented to person, place, and time. He appears well-developed and well-nourished. No distress.  HENT:  Head: Normocephalic and atraumatic.  Mouth/Throat: Oropharynx is clear and moist.  Eyes: Conjunctivae and EOM are normal. Pupils are equal, round, and reactive to light.  Neck: Normal range of motion. Neck supple.  Cardiovascular: Normal rate, regular rhythm and intact distal pulses.   No murmur heard. Pulmonary/Chest: Tachypnea noted. No respiratory distress. He has wheezes in the right upper field, the right middle field, the right lower field, the left upper field, the left middle field and the left lower field. He has rhonchi in the right upper field, the right middle field, the right lower field, the left upper field, the left middle field and the left lower field. He has no rales.  Abdominal: Soft. He exhibits no distension. There is no tenderness. There is no rebound and no guarding.  Reducible umbilical hernia  Musculoskeletal: Normal range of motion. He exhibits edema. He exhibits no tenderness.  Trace edema in bilateral lower extremities  Neurological: He is alert and oriented to person, place, and time.  Skin: Skin is warm and dry. No rash noted. No erythema.  Psychiatric: He has a normal mood and affect. His behavior is normal.    ED Course  Procedures (including critical care time) Labs Review Labs Reviewed  CBC WITH DIFFERENTIAL - Abnormal; Notable for the following:    WBC 16.6 (*)    Neutrophils Relative % 81 (*)    Neutro Abs 13.5 (*)    Lymphocytes Relative 11 (*)    Monocytes Absolute 1.3 (*)    All other components within normal limits  COMPREHENSIVE METABOLIC PANEL - Abnormal; Notable for the following:    Glucose, Bld 127 (*)    Albumin 3.3 (*)    AST 39 (*)    GFR calc non Af Amer 89 (*)    All  other components within normal limits  PRO B NATRIURETIC PEPTIDE - Abnormal;  Notable for the following:    Pro B Natriuretic peptide (BNP) 1110.0 (*)    All other components within normal limits  POCT I-STAT 3, BLOOD GAS (G3+) - Abnormal; Notable for the following:    pO2, Arterial 108.0 (*)    All other components within normal limits  BLOOD GAS, ARTERIAL  POCT I-STAT TROPONIN I   Imaging Review Dg Chest Port 1 View  05/11/2013   CLINICAL DATA:  Short of breath  EXAM: PORTABLE CHEST - 1 VIEW  COMPARISON:  Radiograph 10/12/2009  FINDINGS: Cardiac silhouette is enlarged. Exam is lordotic. There is bibasilar airspace disease suggesting pulmonary edema. Low lung volumes. No pneumothorax.  IMPRESSION: Cardiomegaly and bibasilar airspace disease suggesting pulmonary edema. Lung bases are poorly evaluated.   Electronically Signed   By: Genevive Bi M.D.   On: 05/11/2013 13:04    EKG Interpretation     Ventricular Rate:  75 PR Interval:  202 QRS Duration: 108 QT Interval:  405 QTC Calculation: 453 R Axis:   44 Text Interpretation:  Sinus rhythm Baseline wander in lead(s) V3 No significant change since last tracing            MDM   1. CHF (congestive heart failure)   2. COPD exacerbation     Patient presenting by EMS on BiPAP due to shortness of breath. Patient has a history of asthma but does not use inhalers regularly. He does complain of shortness of breath and productive cough for the last 3-4 days. When the fire department and EMS arrived his oxygen saturations were in the 60s with improvement the 80s with CPAP. Upon arrival here. Patient is in no acute distress he is wheezing with rhonchi bilaterally. He denies any symptoms concerning for CHF except some mild ankle edema. He denies any fever.  Here patient's satting 98% on BiPAP. He is mildly tachypneic. He has no abdominal tenderness. Feel most likely this is an asthma exacerbation however given his history as V. fib and  cardiac arrest also the possibility for CHF.  Normotensive with a normal pulse at this time. EKG without concerning findings.  Albuterol and Atrovent given as well as Solu-Medrol. We'll attempt to wean BiPAP. Chest x-ray, CBC, CMP, troponin, BNP pending  1:35 PM Patient does have a white blood for count of 16,000 but otherwise chest x-ray and BNP are suggestive of fluid overload. Last BNP was 177 and today it over 1000.  Patient did not take Lasix chronically but was given 40 mg IV. He is comfortable on BiPAP and now on 60% oxygen settings. At this time he is not quite ready to wean from BiPAP.  CRITICAL CARE Performed by: Gwyneth Sprout Total critical care time: 30 Critical care time was exclusive of separately billable procedures and treating other patients. Critical care was necessary to treat or prevent imminent or life-threatening deterioration. Critical care was time spent personally by me on the following activities: development of treatment plan with patient and/or surrogate as well as nursing, discussions with consultants, evaluation of patient's response to treatment, examination of patient, obtaining history from patient or surrogate, ordering and performing treatments and interventions, ordering and review of laboratory studies, ordering and review of radiographic studies, pulse oximetry and re-evaluation of patient's condition.  3:17 PM Pt seen by Dr. Elease Hashimoto who feels this is probably more pulm than cardiac.  They will do an echo and given lasix but will feel needs medicine admission for further care and pulm treatment. Gwyneth Sprout, MD 05/11/13 1336  Gwyneth Sprout, MD 05/11/13 1517  Gwyneth Sprout, MD 05/11/13 [phone]

## 2013-05-11 NOTE — Progress Notes (Signed)
Unit CM UR Completed by [facility] ED CM  W. Nathalia Wismer RN  

## 2013-05-12 ENCOUNTER — Encounter (HOSPITAL_COMMUNITY): Payer: Self-pay | Admitting: Internal Medicine

## 2013-05-12 ENCOUNTER — Inpatient Hospital Stay (HOSPITAL_COMMUNITY): Payer: BC Managed Care – PPO

## 2013-05-12 DIAGNOSIS — I517 Cardiomegaly: Secondary | ICD-10-CM

## 2013-05-12 DIAGNOSIS — J9601 Acute respiratory failure with hypoxia: Secondary | ICD-10-CM

## 2013-05-12 LAB — BASIC METABOLIC PANEL
BUN: 32 mg/dL — ABNORMAL HIGH (ref 6–23)
CO2: 24 mEq/L (ref 19–32)
Calcium: 8.2 mg/dL — ABNORMAL LOW (ref 8.4–10.5)
Creatinine, Ser: 0.81 mg/dL (ref 0.50–1.35)
GFR calc non Af Amer: >90 mL/min (ref >90)
Glucose, Bld: 154 mg/dL — ABNORMAL HIGH (ref 70–99)
Sodium: 140 mEq/L (ref 135–145)

## 2013-05-12 LAB — CBC
MCH: 30.6 pg (ref 26.0–34.0)
MCV: 88.8 fL (ref 78.0–100.0)
Platelets: 273 10*3/uL (ref 150–400)
RDW: 13.9 % (ref 11.5–15.5)
WBC: 15.2 10*3/uL — ABNORMAL HIGH (ref 4.0–10.5)

## 2013-05-12 MED ORDER — ZOLPIDEM TARTRATE 5 MG PO TABS
5.0000 mg | ORAL_TABLET | Freq: Every evening | ORAL | Status: DC | PRN
  Administered 2013-05-12: 5 mg via ORAL
  Filled 2013-05-12: qty 1

## 2013-05-12 MED ORDER — IRBESARTAN 75 MG PO TABS
75.0000 mg | ORAL_TABLET | Freq: Every day | ORAL | Status: DC
  Administered 2013-05-14 – 2013-05-15 (×2): 75 mg via ORAL
  Filled 2013-05-12 (×4): qty 1

## 2013-05-12 MED ORDER — POTASSIUM CHLORIDE CRYS ER 20 MEQ PO TBCR
20.0000 meq | EXTENDED_RELEASE_TABLET | Freq: Once | ORAL | Status: AC
  Administered 2013-05-12: 20 meq via ORAL
  Filled 2013-05-12: qty 1

## 2013-05-12 MED ORDER — PNEUMOCOCCAL VAC POLYVALENT 25 MCG/0.5ML IJ INJ
0.5000 mL | INJECTION | INTRAMUSCULAR | Status: DC
  Filled 2013-05-12: qty 0.5

## 2013-05-12 MED ORDER — FUROSEMIDE 10 MG/ML IJ SOLN
40.0000 mg | Freq: Once | INTRAMUSCULAR | Status: AC
  Administered 2013-05-12: 40 mg via INTRAVENOUS
  Filled 2013-05-12: qty 4

## 2013-05-12 NOTE — Progress Notes (Signed)
Patient Name: Robert York      SUBJECTIVE:aditted with repsiratory failure attributed to COPD  BNP >1000 Getting better  With hx of cardiac arrest insetting of hypokalemia  Past Medical History  Diagnosis Date  . Hyperlipidemia   . Hypertension   . Allergic rhinitis due to pollen   . Ventricular fibrillation 2011    leading to cardiac arrest  . Respiratory distress, acute   . Hypernatremia   . MRSA (methicillin resistant staphylococcus aureus) pneumonia   . Thrombocytopenia   . DVT (deep venous thrombosis)     left calf and left greater saphenous vein   . COPD (chronic obstructive pulmonary disease)   . Alcohol dependence     history of  . Anxiety disorder   . Aortic root enlargement 02/04/2012    Echo 2013 46 mm 2012 45 mm     Scheduled Meds:  Scheduled Meds: . albuterol  2.5 mg Nebulization Q6H  . ARIPiprazole  2 mg Oral q morning - 10a  . aspirin EC  81 mg Oral Daily  . carvedilol  12.5 mg Oral BID WC  . enoxaparin (LOVENOX) injection  40 mg Subcutaneous Q24H  . ipratropium  0.5 mg Nebulization Q6H  . irbesartan  75 mg Oral Daily  . levofloxacin (LEVAQUIN) IV  750 mg Intravenous Q24H  . methylPREDNISolone (SOLU-MEDROL) injection  60 mg Intravenous Q6H  . pantoprazole (PROTONIX) IV  40 mg Intravenous Q24H  . [START ON 05/13/2013] pneumococcal 23 valent vaccine  0.5 mL Intramuscular Tomorrow-1000  . sertraline  100 mg Oral q morning - 10a  . sodium chloride  3 mL Intravenous Q12H  . sodium chloride  3 mL Intravenous Q12H   Continuous Infusions:   PHYSICAL EXAM Filed Vitals:   05/12/13 0036 05/12/13 0113 05/12/13 0305 05/12/13 0441  BP: 120/73   109/60  Pulse: 70   65  Temp: 98.1 F (36.7 C)   97.6 F (36.4 C)  TempSrc: Oral   Oral  Resp: 24  27 26   Height:      Weight:      SpO2: 93% 92% 96% 92%    General appearance: mild distress Neck: no JVD and supple, symmetrical, trachea midline Lungs: wheezes bilaterally Heart: regular rate  and rhythm, S1, S2 normal and systolic murmur: systolic ejection 2/6, crescendo and decrescendo at 2nd right intercostal space Abdomen: soft, non-tender; bowel sounds normal; no masses,  no organomegaly Extremities: extremities normal, atraumatic, no cyanosis or edema Skin: Skin color, texture, turgor normal. No rashes or lesions Neurologic: Grossly normal  TELEMETRY: Reviewed telemetry pt in  nsr     Intake/Output Summary (Last 24 hours) at 05/12/13 0743 Last data filed at 05/12/13 0200  Gross per 24 hour  Intake    150 ml  Output   1400 ml  Net  -1250 ml    LABS: Basic Metabolic Panel:  Recent Labs Lab 05/11/13 1154 05/12/13 0500  NA 139 140  K 4.2 4.2  CL 102 104  CO2 24 24  GLUCOSE 127* 154*  BUN 22 32*  CREATININE 0.88 0.81  CALCIUM 8.9 8.2*   Cardiac Enzymes:  Recent Labs  05/11/13 2241 05/12/13 0500  TROPONINI <0.30 <0.30   CBC:  Recent Labs Lab 05/11/13 1154 05/12/13 0500  WBC 16.6* 15.2*  NEUTROABS 13.5*  --   HGB 15.4 14.8  HCT 44.6 43.0  MCV 86.3 88.8  PLT 279 273   PROTIME: No results found for this  basename: LABPROT, INR,  in the last 72 hours Liver Function Tests:  Recent Labs  05/11/13 1154  AST 39*  ALT 24  ALKPHOS 97  BILITOT 0.6  PROT 7.8  ALBUMIN 3.3*   No results found for this basename: LIPASE, AMYLASE,  in the last 72 hours BNP: BNP (last 3 results)  Recent Labs  05/11/13 1154  PROBNP 1110.0*   Echo pending  ASSESSMENT AND PLAN:  Principal Problem:   Acute respiratory failure Active Problems:   HYPERTENSION   Leukocytosis   Tobacco abuse   COPD exacerbation elevated BNP  conintue therapy f o copd therapy awiat echo   Signed, Sherryl Manges MD  05/12/2013

## 2013-05-12 NOTE — Progress Notes (Signed)
  Echocardiogram 2D Echocardiogram has been performed.  Arvil Chaco 05/12/2013, 9:50 AM

## 2013-05-12 NOTE — Progress Notes (Signed)
Pt. Declined to use BSC or bedpan. Placed patient on 6L [place] to restroom. Desat, placed back on 50%non rebreather once back to bed. Sat back up to 88-91%. Pt. Had BM.

## 2013-05-12 NOTE — Progress Notes (Signed)
Dr. Donette Larry called re cxr.  Order(s): 40mg  IV lasix once. Hold avapro for sys <120. Kcl PO once. BMP and BNP in AM.

## 2013-05-12 NOTE — Progress Notes (Signed)
Subjective: Some better On NRB- off Bipap  Objective: Vital signs in last 24 hours: Temp:  [97.6 F (36.4 C)-98.2 F (36.8 C)] 97.6 F (36.4 C) (10/21 0441) Pulse Rate:  [53-85] 65 (10/21 0441) Resp:  [20-32] 26 (10/21 0441) BP: (92-123)/(49-87) 109/60 mmHg (10/21 0441) SpO2:  [72 %-99 %] 92 % (10/21 0441) FiO2 (%):  [50 %-100 %] 50 % (10/21 0441) Weight:  [113.3 kg (249 lb 12.5 oz)-116.6 kg (257 lb 0.9 oz)] 113.3 kg (249 lb 12.5 oz) (10/20 2049) Weight change:  Last BM Date: 05/11/13  Intake/Output from previous day: 10/20 0701 - 10/21 0700 In: 150 [IV Piggyback:150] Out: 1400 [Urine:1400] Intake/Output this shift:    General appearance: alert Resp: rhonchi bibasilar and wheezes bilaterally Cardio: regular rate and rhythm GI: soft, non-tender; bowel sounds normal; no masses,  no organomegaly  Lab Results:  Recent Labs  05/11/13 1154 05/12/13 0500  WBC 16.6* 15.2*  HGB 15.4 14.8  HCT 44.6 43.0  PLT 279 273   BMET  Recent Labs  05/11/13 1154 05/12/13 0500  NA 139 140  K 4.2 4.2  CL 102 104  CO2 24 24  GLUCOSE 127* 154*  BUN 22 32*  CREATININE 0.88 0.81  CALCIUM 8.9 8.2*    Studies/Results: Dg Chest Port 1 View  05/11/2013   CLINICAL DATA:  Short of breath  EXAM: PORTABLE CHEST - 1 VIEW  COMPARISON:  Radiograph 10/12/2009  FINDINGS: Cardiac silhouette is enlarged. Exam is lordotic. There is bibasilar airspace disease suggesting pulmonary edema. Low lung volumes. No pneumothorax.  IMPRESSION: Cardiomegaly and bibasilar airspace disease suggesting pulmonary edema. Lung bases are poorly evaluated.   Electronically Signed   By: Genevive Bi M.D.   On: 05/11/2013 13:04    Medications: I have reviewed the patient's current medications.  Assessment/Plan: repiratory failure- with underlying COPD exacerbation/ PNA- continue O2 titration as tolerated Continue Nebs/ Steroids/ Levaquin WBC better Viral study pending Repeat CXR CAD/ HTN- BP mild low- hold  Felodipine CHF mild BNP elevated- got lasix dose- Troponin negative- Echo Pending H/o tob use- ? Nicotine patch if needed    LOS: 1 day   Mahathi Pokorney 05/12/2013, 7:37 AM

## 2013-05-12 NOTE — Progress Notes (Signed)
Attempted [place] with breakfast. Pt. desat to 79% while on 6 liters per nasal cannula. Unable to regain appropriate saturation level (stayed at 80%). Placed back on 50% non-rebreather, 15 liters oxygen, saturation 88-90%. Pt. Will take one bite and place mask back on, eating very slowly. Pt. Declined chair for breakfast, plan: will get up after and stay up for lunch. Will attempt oxygen weaning in afternoon after ambulation.

## 2013-05-13 DIAGNOSIS — J984 Other disorders of lung: Secondary | ICD-10-CM

## 2013-05-13 DIAGNOSIS — F17200 Nicotine dependence, unspecified, uncomplicated: Secondary | ICD-10-CM

## 2013-05-13 DIAGNOSIS — J441 Chronic obstructive pulmonary disease with (acute) exacerbation: Secondary | ICD-10-CM

## 2013-05-13 DIAGNOSIS — G478 Other sleep disorders: Secondary | ICD-10-CM

## 2013-05-13 LAB — CBC
HCT: 42.1 % (ref 39.0–52.0)
Hemoglobin: 14.5 g/dL (ref 13.0–17.0)
MCH: 30.6 pg (ref 26.0–34.0)
MCHC: 34.4 g/dL (ref 30.0–36.0)
Platelets: 279 10*3/uL (ref 150–400)
RBC: 4.74 MIL/uL (ref 4.22–5.81)
RDW: 13.7 % (ref 11.5–15.5)
WBC: 17.8 10*3/uL — ABNORMAL HIGH (ref 4.0–10.5)

## 2013-05-13 LAB — BASIC METABOLIC PANEL
BUN: 31 mg/dL — ABNORMAL HIGH (ref 6–23)
CO2: 24 mEq/L (ref 19–32)
Chloride: 100 mEq/L (ref 96–112)
Creatinine, Ser: 0.72 mg/dL (ref 0.50–1.35)
GFR calc Af Amer: >90 mL/min (ref >90)
GFR calc non Af Amer: >90 mL/min (ref >90)

## 2013-05-13 LAB — PRO B NATRIURETIC PEPTIDE: Pro B Natriuretic peptide (BNP): 460.2 pg/mL — ABNORMAL HIGH (ref 0–125)

## 2013-05-13 LAB — INFLUENZA PANEL BY PCR (TYPE A & B): Influenza A By PCR: NEGATIVE

## 2013-05-13 MED ORDER — METHYLPREDNISOLONE SODIUM SUCC 125 MG IJ SOLR
60.0000 mg | Freq: Three times a day (TID) | INTRAMUSCULAR | Status: DC
  Administered 2013-05-13 – 2013-05-14 (×3): 60 mg via INTRAVENOUS
  Filled 2013-05-13 (×6): qty 0.96

## 2013-05-13 MED ORDER — FUROSEMIDE 20 MG PO TABS
20.0000 mg | ORAL_TABLET | Freq: Every day | ORAL | Status: DC
  Administered 2013-05-13: 20 mg via ORAL
  Filled 2013-05-13 (×2): qty 1

## 2013-05-13 MED ORDER — ZOLPIDEM TARTRATE 5 MG PO TABS
2.5000 mg | ORAL_TABLET | Freq: Once | ORAL | Status: AC
  Administered 2013-05-13: 2.5 mg via ORAL
  Filled 2013-05-13: qty 1

## 2013-05-13 MED ORDER — CARVEDILOL 6.25 MG PO TABS
6.2500 mg | ORAL_TABLET | Freq: Two times a day (BID) | ORAL | Status: DC
  Administered 2013-05-13 – 2013-05-14 (×3): 6.25 mg via ORAL
  Filled 2013-05-13 (×7): qty 1

## 2013-05-13 MED ORDER — SALINE SPRAY 0.65 % NA SOLN
1.0000 | NASAL | Status: DC | PRN
  Filled 2013-05-13: qty 44

## 2013-05-13 MED ORDER — PNEUMOCOCCAL VAC POLYVALENT 25 MCG/0.5ML IJ INJ
0.5000 mL | INJECTION | INTRAMUSCULAR | Status: AC | PRN
  Administered 2013-05-15: 0.5 mL via INTRAMUSCULAR
  Filled 2013-05-13: qty 0.5

## 2013-05-13 NOTE — Progress Notes (Signed)
Subjective: Feel some better C/o throat dry Sinus bradycardia  Objective: Vital signs in last 24 hours: Temp:  [97.6 F (36.4 C)-98.3 F (36.8 C)] 97.8 F (36.6 C) (10/22 0350) Pulse Rate:  [53-61] 57 (10/22 0350) Resp:  [22-29] 28 (10/22 0350) BP: (102-124)/(59-87) 102/87 mmHg (10/22 0350) SpO2:  [86 %-96 %] 92 % (10/22 0641) FiO2 (%):  [50 %] 50 % (10/22 0641) Weight change:  Last BM Date: 05/11/13  Intake/Output from previous day: 10/21 0701 - 10/22 0700 In: 220 [P.O.:220] Out: 2450 [Urine:2450] Intake/Output this shift:    General appearance: alert Resp: rhonchi bibasilar and wheezes bilaterally Cardio: regular rate and rhythm  Lab Results:  Recent Labs  05/12/13 0500 05/13/13 0350  WBC 15.2* 17.8*  HGB 14.8 14.5  HCT 43.0 42.1  PLT 273 279   BMET  Recent Labs  05/11/13 1154 05/12/13 0500  NA 139 140  K 4.2 4.2  CL 102 104  CO2 24 24  GLUCOSE 127* 154*  BUN 22 32*  CREATININE 0.88 0.81  CALCIUM 8.9 8.2*    Studies/Results: Dg Chest Port 1 View  05/12/2013   CLINICAL DATA:  Shortness of breath and chest tightness.  EXAM: PORTABLE CHEST - 1 VIEW  COMPARISON:  CHEST x-ray 05/11/2013.  FINDINGS: Lung volumes are low. No definite acute consolidative airspace disease. No definite pleural effusions. No pneumothorax. There is cephalization of the pulmonary vasculature and slight indistinctness of the interstitial markings suggestive of mild pulmonary edema. Mild cardiomegaly. The patient is rotated to the right on today's exam, resulting in distortion of the mediastinal contours and reduced diagnostic sensitivity and specificity for mediastinal pathology.  IMPRESSION: 1. Findings, as above, compatible with mild congestive heart failure. Overall, the edema is slightly improved compared to yesterday's examination.   Electronically Signed   By: Trudie Reed M.D.   On: 05/12/2013 08:10   Dg Chest Port 1 View  05/11/2013   CLINICAL DATA:  Short of breath   EXAM: PORTABLE CHEST - 1 VIEW  COMPARISON:  Radiograph 10/12/2009  FINDINGS: Cardiac silhouette is enlarged. Exam is lordotic. There is bibasilar airspace disease suggesting pulmonary edema. Low lung volumes. No pneumothorax.  IMPRESSION: Cardiomegaly and bibasilar airspace disease suggesting pulmonary edema. Lung bases are poorly evaluated.   Electronically Signed   By: Genevive Bi M.D.   On: 05/11/2013 13:04    Medications: I have reviewed the patient's current medications.  Assessment/Plan: repiratory failure- with underlying COPD exacerbation/ PNA- continue O2 titration as tolerated  Continue Nebs/ Steroids/ Levaquin - pulmonary consult today- taper steroid slowly CHF- respond to Lasix- - 2L- Echo ok EF 55% Viral study pending  Repeat CXR - CHF improved- - start on lasix 20 mg daily po Bradycardia- decrease Coreg to 3.125 bid CAD/ HTN- BP mild low- d/cFelodipine  H/o tob use- ? Nicotine patch if needed H/o depression and mood- continue abilify and zoloft   LOS: 2 days   Ruble Pumphrey 05/13/2013, 7:37 AM

## 2013-05-13 NOTE — Consult Note (Signed)
PULMONARY  / CRITICAL CARE MEDICINE  Name: Robert York MRN: [id number] DOB: January 01, 1951    ADMISSION DATE:  05/11/2013 CONSULTATION DATE:  10-22  REFERRING MD :  Good rich. 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 He started back smoking 1 year ago. 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 He started back smoking 1 year ago. He quit drinking 8 years ago. He notes increasing SOB over 3 years and attributes it to his chest compressions. He had viral exposure from a daughter figure. When admitted his sats were in the 70% range. He has improved with current treatment with an negative i/o. PCCM asked to evaluate. 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  PAST MEDICAL HISTORY :  Past Medical History  Diagnosis Date  . Hyperlipidemia   . Hypertension   . Allergic rhinitis due to pollen   . Ventricular fibrillation 2011    associated with hypokalemia  . Respiratory distress, acute   . Hypernatremia   . MRSA (methicillin resistant staphylococcus aureus) pneumonia   . Thrombocytopenia   . DVT (deep venous thrombosis)     left calf and left greater saphenous vein   . COPD (chronic obstructive pulmonary disease)   . Alcohol dependence     history of  . Anxiety disorder   . Aortic root enlargement 02/04/2012    Echo 2013 46 mm 2012 45 mm    Past Surgical History  Procedure Laterality Date  . Back surgery    . Tonsillectomy    . Hernia repair     Prior to Admission medications   Medication Sig Start Date End Date Taking? Authorizing Provider  albuterol  (PROVENTIL HFA;VENTOLIN HFA) 108 (90 BASE) MCG/ACT inhaler Inhale 2 puffs into the lungs every 6 (six) hours as needed for wheezing.   Yes Historical Provider, MD  ARIPiprazole (ABILIFY) 2 MG tablet Take 2 mg by mouth every morning.   Yes Historical Provider, MD  Ascorbic Acid (VITAMIN C) 1000 MG tablet Take 1,000 mg by mouth daily.   Yes Historical Provider, MD  aspirin EC 81 MG tablet Take 81 mg by mouth daily.   Yes Historical Provider, MD  carvedilol (COREG) 12.5 MG tablet Take 1 tablet (12.5 mg total) by mouth 2 (two) times daily with a meal. 01/28/13  Yes Duke Salvia, MD  DM-Doxylamine-Acetaminophen (NYQUIL COLD & FLU PO) Take 30 mLs by mouth daily as needed (for cold).   Yes Historical Provider, MD  felodipine (PLENDIL) 5 MG 24 hr tablet Take 5 mg by mouth 2 (two) times daily.    Yes Historical Provider, MD  fish oil-omega-3 fatty acids 1000 MG capsule Take 2 g by mouth 2 (two) times daily.    Yes Historical Provider, MD  ibuprofen (ADVIL,MOTRIN) 200 MG tablet Take 400 mg by mouth 2 (two) times daily.   Yes Historical Provider, MD  MAGNESIUM PO Take 1 tablet by mouth every evening.   Yes Historical Provider, MD  naproxen sodium (ANAPROX) 220 MG tablet Take 440  mg by mouth daily as needed (for pain).   Yes Historical Provider, MD  NIACIN PO Take 1 tablet by mouth 2 (two) times daily.    Yes Historical Provider, MD  omeprazole (PRILOSEC) 20 MG capsule Take 20 mg by mouth daily.    Yes Historical Provider, MD  Pseudoephedrine-APAP-DM (DAYQUIL MULTI-SYMPTOM COLD/FLU PO) Take 2 capsules by mouth daily as needed (for cold).   Yes Historical Provider, MD  sertraline (ZOLOFT) 100 MG tablet Take 100 mg by mouth every morning.   Yes Historical Provider, MD  Thiamine HCl (VITAMIN B-1) 100 MG tablet Take 100 mg by mouth daily.     Yes Historical Provider, MD  valsartan (DIOVAN) 80 MG tablet Take 80 mg by mouth daily.   Yes Historical Provider, MD  zolpidem (AMBIEN) 10 MG tablet Take 5 mg by mouth at  bedtime as needed.    Yes Historical Provider, MD   Allergies  Allergen Reactions  . Lisinopril Other (See Comments)    Makes COPD worse, rare reaction-per patient.    . Amoxicillin Itching and Rash    FAMILY HISTORY:  Family History  Problem Relation Age of Onset  . Allergies Paternal Grandfather   . Asthma Father   . Heart disease Paternal Grandfather   . Breast cancer Mother    SOCIAL HISTORY:  reports that he quit smoking about 7 years ago. His smoking use included Cigarettes. He has a 60 pack-year smoking history. He has never used smokeless tobacco. He reports that he drinks alcohol. He reports that he does not use illicit drugs. He started back smoking one year ago. 1ppd REVIEW OF SYSTEMS:   10 point review of system taken, please see HPI for positives and negatives.   SUBJECTIVE:   VITAL SIGNS: Temp:  [97.6 F (36.4 C)-98.3 F (36.8 C)] 97.7 F (36.5 C) (10/22 0800) Pulse Rate:  [53-61] 53 (10/22 0800) Resp:  [22-29] 22 (10/22 0800) BP: (102-124)/(59-87) 114/69 mmHg (10/22 0800) SpO2:  [86 %-96 %] 91 % (10/22 0800) FiO2 (%):  [50 %] 50 % (10/22 0800) HEMODYNAMICS:   VENTILATOR SETTINGS: Vent Mode:  [-]  FiO2 (%):  [50 %] 50 % INTAKE / OUTPUT: Intake/Output     10/21 0701 - 10/22 0700 10/22 0701 - 10/23 0700   P.O. 220 240   IV Piggyback     Total Intake(mL/kg) 220 (1.9) 240 (2.1)   Urine (mL/kg/hr) 2450 (0.9) 450 (1)   Total Output 2450 450   Net -2230 -210        Stool Occurrence 1 x      PHYSICAL EXAMINATION: General:  Robust 62 yo male on vent mask Neuro:  Intact, follows commands, gregarious. HEENT: No LAN/JVD Cardiovascular:  HSR RRR Lungs: faint exp wheeze, barrel chested Abdomen:  Obese + bs Musculoskeletal:  Intact Skin: warm, dry , no edema   LABS:  CBC Recent Labs     05/11/13  1154  05/12/13  0500  05/13/13  0350  WBC  16.6*  15.2*  17.8*  HGB  15.4  14.8  14.5  HCT  44.6  43.0  42.1  PLT  279  273  279   Coag's No  results found for this basename: APTT, INR,  in the last 72 hours BMET Recent Labs     05/11/13  1154  05/12/13  0500  05/13/13  0350  NA  139  140  137  K  4.2  4.2  4.2  CL  102  104  100  CO2  24  24  24   BUN  22  32*  31*  CREATININE  0.88  0.81  0.72  GLUCOSE  127*  154*  154*   Electrolytes Recent Labs     05/11/13  1154  05/12/13  0500  05/13/13  0350  CALCIUM  8.9  8.2*  8.8   Sepsis Markers No results found for this basename: LACTICACIDVEN, PROCALCITON, O2SATVEN,  in the last 72 hours ABG Recent Labs     05/11/13  1216  PHART  7.389  PCO2ART  38.8  PO2ART  108.0*   Liver Enzymes Recent Labs     05/11/13  1154  AST  39*  ALT  24  ALKPHOS  97  BILITOT  0.6  ALBUMIN  3.3*   Cardiac Enzymes Recent Labs     05/11/13  1154  05/11/13  2241  05/12/13  0500  05/12/13  0845  05/13/13  0350  TROPONINI   --   <0.30  <0.30  <0.30   --   PROBNP  1110.0*   --    --    --   460.2*   Glucose No results found for this basename: GLUCAP,  in the last 72 hours  Imaging Dg Chest Port 1 View  05/12/2013   CLINICAL DATA:  Shortness of breath and chest tightness.  EXAM: PORTABLE CHEST - 1 VIEW  COMPARISON:  CHEST x-ray 05/11/2013.  FINDINGS: Lung volumes are low. No definite acute consolidative airspace disease. No definite pleural effusions. No pneumothorax. There is cephalization of the pulmonary vasculature and slight indistinctness of the interstitial markings suggestive of mild pulmonary edema. Mild cardiomegaly. The patient is rotated to the right on today's exam, resulting in distortion of the mediastinal contours and reduced diagnostic sensitivity and specificity for mediastinal pathology.  IMPRESSION: 1. Findings, as above, compatible with mild congestive heart failure. Overall, the edema is slightly improved compared to yesterday's examination.   Electronically Signed   By: Trudie Reed M.D.   On: 05/12/2013 08:10   Dg Chest Port 1 View  05/11/2013    CLINICAL DATA:  Short of breath  EXAM: PORTABLE CHEST - 1 VIEW  COMPARISON:  Radiograph 10/12/2009  FINDINGS: Cardiac silhouette is enlarged. Exam is lordotic. There is bibasilar airspace disease suggesting pulmonary edema. Low lung volumes. No pneumothorax.  IMPRESSION: Cardiomegaly and bibasilar airspace disease suggesting pulmonary edema. Lung bases are poorly evaluated.   Electronically Signed   By: Genevive Bi M.D.   On: 05/11/2013 13:04    Intake/Output Summary (Last 24 hours) at 05/13/13 1111 Last data filed at 05/13/13 0900  Gross per 24 hour  Intake    460 ml  Output   2900 ml  Net  -2440 ml     CXR: see above   ASSESSMENT / PLAN:  PULMONARY A:Acute on chronic hypoxic resp failure in setting of COPD with tobacco abuse, CHF, recent viral exposure. Suspected  OSA P:   O2 as needed Steroids Diuresis BD's Check viral panel but too late for antivirals. Cont abx for presumed bronchitis Follow with Sleep MD as outpatient for sleep evaluation and COPD treatment -- will need appointment made. Stop smoking, was discussed with the patient. PFT's when he is better. PE is very unlikely given improvement with diureses and steroids.  CARDIOVASCULAR A: Hx of cardiac arrest from electrolyte imbalance. Coded for an hour.      HTN, 2d  With mild dialtion ef 55%  Pulmonary edema/CHF elevated probnp P:  Better with negative 2.4 litres 10-22 Determine his dry weight   RENAL Lab Results  Component Value Date   CREATININE 0.72 05/13/2013   CREATININE 0.81 05/12/2013   CREATININE 0.88 05/11/2013    A: No acute issue  P:     GASTROINTESTINAL A:  No acute issue  P:     HEMATOLOGIC A: No acute issue  P:    INFECTIOUS A:  Presumed bronchitis with white sputum , chills and recent viral exposure. P:   Levaquin noted Consider viral studies  ENDOCRINE A:  No acute issue   P:     NEUROLOGIC A:  Intact, No acute process P:     TODAY'S SUMMARY:  10-22  Reports breathing better with abx,O2. Steroids and diuresis.  Robert York ACNP Ad[facility] Pollack PCCM Pager [phone] till 3 pm If no answer page [phone] 05/13/2013, 11:03 AM  COPD, responded to steroids and diureses.  Echo per primary, no need for viral cultures at this point given that it is too late for anti-virals.  PE is a consideration but not likely given improvement with steroids and diureses, no need to risk kidney function given diureses at this time.  May consider as outpatient.  Will need PFTs as outpatient and f/u with sleep MD for ?OSA as outpatient.  Patient seen and examined, agree with above note.  I dictated the care and orders written for this patient under my direction.  Robert Reedy, MD [phone]

## 2013-05-14 DIAGNOSIS — I5031 Acute diastolic (congestive) heart failure: Secondary | ICD-10-CM

## 2013-05-14 LAB — CBC
MCH: 29.7 pg (ref 26.0–34.0)
MCHC: 33.5 g/dL (ref 30.0–36.0)
MCV: 88.6 fL (ref 78.0–100.0)
Platelets: 267 10*3/uL (ref 150–400)
RBC: 4.82 MIL/uL (ref 4.22–5.81)
RDW: 13.5 % (ref 11.5–15.5)

## 2013-05-14 LAB — BASIC METABOLIC PANEL
BUN: 20 mg/dL (ref 6–23)
CO2: 24 mEq/L (ref 19–32)
Calcium: 8.5 mg/dL (ref 8.4–10.5)
Creatinine, Ser: 0.67 mg/dL (ref 0.50–1.35)
GFR calc non Af Amer: >90 mL/min (ref >90)
Glucose, Bld: 155 mg/dL — ABNORMAL HIGH (ref 70–99)
Sodium: 134 mEq/L — ABNORMAL LOW (ref 135–145)

## 2013-05-14 LAB — CMV IGM: CMV IgM: <8 AU/mL (ref <30.00)

## 2013-05-14 MED ORDER — TIOTROPIUM BROMIDE MONOHYDRATE 18 MCG IN CAPS: 18.0000 ug | ORAL_CAPSULE | Freq: Every day | RESPIRATORY_TRACT | Status: DC

## 2013-05-14 MED ORDER — PREDNISONE 20 MG PO TABS: 60.0000 mg | ORAL_TABLET | Freq: Every day | ORAL | Status: DC

## 2013-05-14 MED ORDER — PREDNISONE 10 MG PO TABS
60.0000 mg | ORAL_TABLET | Freq: Every day | ORAL | Status: DC
  Administered 2013-05-15: 09:00:00 60 mg via ORAL
  Filled 2013-05-14 (×2): qty 1

## 2013-05-14 MED ORDER — TIOTROPIUM BROMIDE MONOHYDRATE 18 MCG IN CAPS
18.0000 ug | ORAL_CAPSULE | Freq: Every day | RESPIRATORY_TRACT | Status: DC
  Administered 2013-05-15: 18 ug via RESPIRATORY_TRACT
  Filled 2013-05-14: qty 5

## 2013-05-14 MED ORDER — FUROSEMIDE 10 MG/ML IJ SOLN
INTRAMUSCULAR | Status: AC
  Filled 2013-05-14: qty 4

## 2013-05-14 MED ORDER — ALPRAZOLAM 0.25 MG PO TABS: 0.2500 mg | ORAL_TABLET | Freq: Three times a day (TID) | ORAL | Status: DC | PRN

## 2013-05-14 MED ORDER — LEVOFLOXACIN 750 MG PO TABS
750.0000 mg | ORAL_TABLET | ORAL | Status: DC
  Administered 2013-05-14: 750 mg via ORAL
  Filled 2013-05-14 (×2): qty 1

## 2013-05-14 MED ORDER — ALPRAZOLAM 0.25 MG PO TABS
0.2500 mg | ORAL_TABLET | Freq: Three times a day (TID) | ORAL | Status: DC | PRN
  Administered 2013-05-14: 0.25 mg via ORAL
  Filled 2013-05-14: qty 1

## 2013-05-14 MED ORDER — ALBUTEROL SULFATE (5 MG/ML) 0.5% IN NEBU: 2.5000 mg | INHALATION_SOLUTION | Freq: Four times a day (QID) | RESPIRATORY_TRACT | Status: DC | PRN

## 2013-05-14 MED ORDER — PANTOPRAZOLE SODIUM 40 MG PO TBEC
40.0000 mg | DELAYED_RELEASE_TABLET | Freq: Every day | ORAL | Status: DC
  Administered 2013-05-14 – 2013-05-15 (×2): 40 mg via ORAL
  Filled 2013-05-14 (×2): qty 1

## 2013-05-14 MED ORDER — FUROSEMIDE 10 MG/ML IJ SOLN
40.0000 mg | Freq: Once | INTRAMUSCULAR | Status: AC
  Administered 2013-05-14: 40 mg via INTRAVENOUS

## 2013-05-14 MED ORDER — CARVEDILOL 12.5 MG PO TABS: 6.2500 mg | ORAL_TABLET | Freq: Two times a day (BID) | ORAL | Status: DC

## 2013-05-14 NOTE — Progress Notes (Signed)
Utilization review completed.  

## 2013-05-14 NOTE — Progress Notes (Signed)
Subjective: Still SOB  Unchanged from yesterday. Objective: Filed Vitals:   05/14/13 0340 05/14/13 0600 05/14/13 0713 05/14/13 0834  BP: 115/55   131/75  Pulse: 53 57  68  Temp: 98.3 F (36.8 C)  97.7 F (36.5 C)   TempSrc: Oral  Oral   Resp: 21 22    Height:      Weight:      SpO2: 94% 92%     Weight change:   Intake/Output Summary (Last 24 hours) at 05/14/13 1009 Last data filed at 05/14/13 0713  Gross per 24 hour  Intake   1110 ml  Output   1150 ml  Net    -40 ml    General: Alert, awake, oriented x3, in no acute distress Neck:  JVP is increased Heart: Regular rate and rhythm, without murmurs, rubs, gallops.  Lungs: Decreased airflow Exemities:  No edema.   Neuro: Grossly intact, nonfocal.  Tele:  SR/ST  Lab Results: Results for orders placed during the hospital encounter of 05/11/13 (from the past 24 hour(s))  INFLUENZA PANEL BY PCR     Status: None   Collection Time    05/13/13 12:10 PM      Result Value Range   Influenza A By PCR NEGATIVE  NEGATIVE   Influenza B By PCR NEGATIVE  NEGATIVE   H1N1 flu by pcr NOT DETECTED  NOT DETECTED  BASIC METABOLIC PANEL     Status: Abnormal   Collection Time    05/14/13  6:00 AM      Result Value Range   Sodium 134 (*) 135 - 145 mEq/L   Potassium 4.5  3.5 - 5.1 mEq/L   Chloride 98  96 - 112 mEq/L   CO2 24  19 - 32 mEq/L   Glucose, Bld 155 (*) 70 - 99 mg/dL   BUN 20  6 - 23 mg/dL   Creatinine, Ser 0.45  0.50 - 1.35 mg/dL   Calcium 8.5  8.4 - 40.9 mg/dL   GFR calc non Af Amer >90  >90 mL/min   GFR calc Af Amer >90  >90 mL/min  CBC     Status: Abnormal   Collection Time    05/14/13  6:00 AM      Result Value Range   WBC 14.5 (*) 4.0 - 10.5 K/uL   RBC 4.82  4.22 - 5.81 MIL/uL   Hemoglobin 14.3  13.0 - 17.0 g/dL   HCT 81.1  91.4 - 78.2 %   MCV 88.6  78.0 - 100.0 fL   MCH 29.7  26.0 - 34.0 pg   MCHC 33.5  30.0 - 36.0 g/dL   RDW 95.6  21.3 - 08.6 %   Platelets 267  150 - 400 K/uL     Studies/Results: @RISRSLT24 @  Medications: Reviewed   @PROBHOSP @  Impression:  Echo reviewed  LVEF and RVEF reported normal. I would recomm an additional IV lasix 40 mg  Strict I/O  He is 3.7 L neg so far  Hopefully HR will improve as breathing improves.  Continue other meds.  LOS: 3 days   [name] 05/14/2013, 10:09 AM

## 2013-05-14 NOTE — Progress Notes (Signed)
This patient is receiving Levaquin by the IV route. Based on criteria approved by the Pharmacy and Therapeutics Committee, this medication is being converted to the equivalent oral dose form. These criteria include:   . The patient is eating (either orally or per tube) and/or has been taking other orally administered medications for at least 24 hours.  . This patient has no evidence of active gastrointestinal bleeding or impaired GI absorption (gastrectomy, short bowel, patient on TNA or NPO).   If you have questions about this conversion, please contact the pharmacy department.  Brandon Melnick, [facility] 05/14/2013 2:15 PM

## 2013-05-14 NOTE — Progress Notes (Signed)
Subjective: Better on [place] O2   Objective: Vital signs in last 24 hours: Temp:  [97.7 F (36.5 C)-98.3 F (36.8 C)] 98.3 F (36.8 C) (10/23 0340) Pulse Rate:  [51-57] 57 (10/23 0600) Resp:  [17-27] 22 (10/23 0600) BP: (106-128)/(55-69) 115/55 mmHg (10/23 0340) SpO2:  [90 %-94 %] 92 % (10/23 0600) FiO2 (%):  [50 %] 50 % (10/22 1400) Weight change:  Last BM Date: 05/12/13  Intake/Output from previous day: 10/22 0701 - 10/23 0700 In: 1350 [P.O.:1200; IV Piggyback:150] Out: 1250 [Urine:1250] Intake/Output this shift:    General appearance: alert Resp: wheezes bilaterally Cardio: regular rate and rhythm  Lab Results:  Recent Labs  05/13/13 0350 05/14/13 0600  WBC 17.8* 14.5*  HGB 14.5 14.3  HCT 42.1 42.7  PLT 279 267   BMET  Recent Labs  05/12/13 0500 05/13/13 0350  NA 140 137  K 4.2 4.2  CL 104 100  CO2 24 24  GLUCOSE 154* 154*  BUN 32* 31*  CREATININE 0.81 0.72  CALCIUM 8.2* 8.8    Studies/Results: Dg Chest Port 1 View  05/12/2013   CLINICAL DATA:  Shortness of breath and chest tightness.  EXAM: PORTABLE CHEST - 1 VIEW  COMPARISON:  CHEST x-ray 05/11/2013.  FINDINGS: Lung volumes are low. No definite acute consolidative airspace disease. No definite pleural effusions. No pneumothorax. There is cephalization of the pulmonary vasculature and slight indistinctness of the interstitial markings suggestive of mild pulmonary edema. Mild cardiomegaly. The patient is rotated to the right on today's exam, resulting in distortion of the mediastinal contours and reduced diagnostic sensitivity and specificity for mediastinal pathology.  IMPRESSION: 1. Findings, as above, compatible with mild congestive heart failure. Overall, the edema is slightly improved compared to yesterday's examination.   Electronically Signed   By: Trudie Reed M.D.   On: 05/12/2013 08:10    Medications: I have reviewed the patient's current medications.  Assessment/Plan: repiratory failure-  with underlying COPD exacerbation/ PNA- continue O2 titration as tolerated  Continue Nebs/ Steroids/ Levaquin - pulmonary consult - noted-  taper steroid slowly - change to po prednisone CHF-  Lasix- - 2L- Echo ok EF 55%  Viral study negative  Bradycardia- better with lower dose-Coreg to 3.125 bid  CAD/ HTN- BP mild low- d/cFelodipine  H/o depression and mood- continue abilify and zoloft; xanax prn OOB- ambulate with PT Ok to tranfers to telemetry if bed needed. Probable d/c tomorrow- case mangement- for neb machine and O2   LOS: 3 days   Robert York 05/14/2013, 7:36 AM

## 2013-05-14 NOTE — Progress Notes (Signed)
PULMONARY  / CRITICAL CARE MEDICINE  Name: HALFORD GOETZKE MRN: [id number] DOB: 1951-04-17    ADMISSION DATE:  05/11/2013 CONSULTATION DATE:  10-22  REFERRING MD :  Irene Limbo. 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 He started back smoking 1 year ago.  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 He started back smoking 1 year ago. He quit drinking 8 years ago. He notes increasing SOB over 3 years and attributes it to his chest compressions. He had viral exposure from a daughter figure. When admitted his sats were in the 70% range. He has improved with current treatment with a negative i/o. PCCM asked to evaluate. 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  SUBJECTIVE:  Breathing better, up to chair  VITAL SIGNS: Temp:  [97.7 F (36.5 C)-98.3 F (36.8 C)] 97.7 F (36.5 C) (10/23 0713) Pulse Rate:  [51-68] 68 (10/23 0834) Resp:  [17-27] 22 (10/23 0600) BP: (106-131)/(55-75) 131/75 mmHg (10/23 0834) SpO2:  [90 %-94 %] 92 % (10/23 0600) FiO2 (%):  [50 %] 50 % (10/22 1400) HEMODYNAMICS:   VENTILATOR SETTINGS: Vent Mode:  [-]  FiO2 (%):  [50 %] 50 % INTAKE / OUTPUT: Intake/Output     10/22 0701 - 10/23 0700 10/23 0701 - 10/24 0700   P.O. 1200    IV Piggyback 150    Total Intake(mL/kg) 1350 (11.9)    Urine (mL/kg/hr) 1250 (0.5) 350 (0.8)   Total Output 1250 350   Net +100 -350        Urine Occurrence 1 x    Stool Occurrence 1 x      PHYSICAL EXAMINATION: General:  Robust 62 yo male on vent mask Neuro:  Intact, follows commands,  gregarious. HEENT: No LAN/JVD Cardiovascular:  HSR RRR Lungs: faint exp wheeze, barrel chested Abdomen:  Obese + bs Musculoskeletal:  Intact Skin: warm, dry , no edema   LABS:  CBC Recent Labs     05/12/13  0500  05/13/13  0350  05/14/13  0600  WBC  15.2*  17.8*  14.5*  HGB  14.8  14.5  14.3  HCT  43.0  42.1  42.7  PLT  273  279  267   Coag's No results found for this basename: APTT, INR,  in the last 72 hours BMET Recent Labs     05/12/13  0500  05/13/13  0350  05/14/13  0600  NA  140  137  134*  K  4.2  4.2  4.5  CL  104  100  98  CO2  24  24  24   BUN  32*  31*  20  CREATININE  0.81  0.72  0.67  GLUCOSE  154*  154*  155*   Electrolytes Recent Labs     05/12/13  0500  05/13/13  0350  05/14/13  0600  CALCIUM  8.2*  8.8  8.5   Sepsis Markers No results found for this basename: LACTICACIDVEN, PROCALCITON, O2SATVEN,  in the last 72 hours ABG Recent Labs  05/11/13  1216  PHART  7.389  PCO2ART  38.8  PO2ART  108.0*   Liver Enzymes Recent Labs     05/11/13  1154  AST  39*  ALT  24  ALKPHOS  97  BILITOT  0.6  ALBUMIN  3.3*   Cardiac Enzymes Recent Labs     05/11/13  1154  05/11/13  2241  05/12/13  0500  05/12/13  0845  05/13/13  0350  TROPONINI   --   <0.30  <0.30  <0.30   --   PROBNP  1110.0*   --    --    --   460.2*   Glucose No results found for this basename: GLUCAP,  in the last 72 hours  Imaging No results found.  Intake/Output Summary (Last 24 hours) at 05/14/13 1040 Last data filed at 05/14/13 4132  Gross per 24 hour  Intake   1110 ml  Output   1150 ml  Net    -40 ml     CXR:   ASSESSMENT / PLAN:  PULMONARY A:Acute on chronic hypoxic resp failure in setting of COPD with tobacco abuse, CHF, recent viral exposure. Suspected  OSA P:   O2 for SpO2 > 90%. He will likely need for home. He states that he doesn;t want to wear chronically.  Steroids, wean over 10 days Diuresis as tolerated and as directed by Dr Tenny Craw D/c  scheduled nebs and start Spiriva. Should go out on this and review status on f/u with [place] as outpt.  Smoking cessation F/u with Dr Shelle Iron 06/30/13 at 9:15am to assess benefit Spiriva, oxygenation following recovery from acute illness and his risk for OSA.   Levy Pupa, MD, PhD 05/14/2013, 10:59 AM [facility] [phone] or if no answer [phone]

## 2013-05-14 NOTE — Care Management Note (Signed)
    Page 1 of 1   05/14/2013     12:16:20 PM   CARE MANAGEMENT NOTE 05/14/2013  Patient:  Robert York, Robert York   Account Number:  [id number]  Date Initiated:  05/12/2013  Documentation initiated by:  Donn Pierini  Subjective/Objective Assessment:   Pt admitted with COPD/CHF     Action/Plan:   PTA pt lived at home with spouse- NCM to follow pt progression for d/c needs   Anticipated DC Date:  05/15/2013   Anticipated DC Plan:        DC Planning Services  CM consult      Choice offered to / List presented to:     DME arranged  NEBULIZER MACHINE  OXYGEN      DME agency  [facility]        Status of service:  In process, will continue to follow Medicare Important Message given?   (If response is "NO", the following Medicare IM given date fields will be blank) Date Medicare IM given:   Date Additional Medicare IM given:    Discharge Disposition:    Per UR Regulation:  Reviewed for med. necessity/level of care/duration of stay  If discussed at Long Length of Stay Meetings, dates discussed:    Comments:  05/14/13- 1200- Donn Pierini RN,BSN [phone] Referral for nebulizer and home 02- pt with qualifying dx of COPD and sats dropped to 79% while ambulating- orders in for both per Dr. Donette Larry consult - spoke with [place] with [facility] regarding DME needs and potential d/c for tomorrow 05/15/13. [facility] to deliver DME to room prior to discharge.

## 2013-05-14 NOTE — Progress Notes (Addendum)
SATURATION QUALIFICATIONS: (This note is used to comply with regulatory documentation for home oxygen)  Patient Saturations on Room Air at Rest = 90%  Patient Saturations on Room Air while Ambulating = 79%  Patient Saturations on 4 Liters of oxygen while Ambulating = 92%  Please briefly explain why patient needs home oxygen: 

## 2013-05-14 NOTE — Progress Notes (Signed)
Bipap not needed at this time. Pt tolerating [place] well at this time. RT will continue to monitor.

## 2013-05-15 LAB — BASIC METABOLIC PANEL
Calcium: 8.6 mg/dL (ref 8.4–10.5)
Creatinine, Ser: 0.79 mg/dL (ref 0.50–1.35)
GFR calc non Af Amer: >90 mL/min (ref >90)
Glucose, Bld: 99 mg/dL (ref 70–99)
Sodium: 138 mEq/L (ref 135–145)

## 2013-05-15 LAB — PRO B NATRIURETIC PEPTIDE: Pro B Natriuretic peptide (BNP): 322.5 pg/mL — ABNORMAL HIGH (ref 0–125)

## 2013-05-15 LAB — RSV(RESPIRATORY SYNCYTIAL VIRUS) AB, BLOOD

## 2013-05-15 MED ORDER — ALPRAZOLAM 0.25 MG PO TABS: 0.2500 mg | ORAL_TABLET | Freq: Three times a day (TID) | ORAL | Status: AC | PRN

## 2013-05-15 MED ORDER — ALBUTEROL SULFATE (5 MG/ML) 0.5% IN NEBU: 2.5000 mg | INHALATION_SOLUTION | Freq: Four times a day (QID) | RESPIRATORY_TRACT | Status: DC | PRN

## 2013-05-15 MED ORDER — TIOTROPIUM BROMIDE MONOHYDRATE 18 MCG IN CAPS: 18.0000 ug | ORAL_CAPSULE | Freq: Every day | RESPIRATORY_TRACT | Status: DC

## 2013-05-15 MED ORDER — PREDNISONE 20 MG PO TABS: 60.0000 mg | ORAL_TABLET | Freq: Every day | ORAL | Status: DC

## 2013-05-15 NOTE — Discharge Summary (Signed)
Physician Discharge Summary  Patient ID: Robert York MRN: [id number] DOB/AGE: [birth year] 64 y.o.  Admit date: 05/11/2013 Discharge date: 05/15/2013  Admission Diagnoses:  Discharge Diagnoses:  Principal Problem:   Acute respiratory failure  COPD exacerbation Mild CHF History of cardiac arrest in 2013- secondary to hypokalemia bradycardia Active Problems:   HYPERTENSION   Cough   Sleep-disordered breathing   Leukocytosis   Tobacco abuse   COPD exacerbation Hypertension Depression Insomnia   Discharged Condition: good  Hospital Course: 62 years old male admitted with severe hypoxia-acute respiratory failure; one week history of uppe respiratory  symptoms Problem #1: respiratory failure/hypoxia/required BiPAP- COPD exacerbation; started on IV Solu-Melizers oxygen;  Chest x-ray showed mild vascular congestion; he was started on antibiotic levofloxacin; leukocytosis;viral studies negative;patient had upper respiratory symptoms for about one week, he continues to smoke;Patient slowly improved with oxygenation ;probable underlying sleep apnea- he had not had sleep evaluation. Patient's oxygenation improved , will need home O2; Taper prednisone; started on Spiriva; albuterol nebulizer p.r.n.; pulmonary follow up- for sleep evaluation and PFTs outpatient Problem #2: history of cardiac arrest in 2013- chest x-ray showed mild vascular congestion, BNP elevated- diuresis with IV Lasix: 2-D echocardiogram normal LV function. Cardiology follow up. Troponin negative; vascular congestion and heart failure probably  To acute hypoxia. Problem #3: bradycardia- patient remained asymptomatic; Coreg decreased to 6.25 mg; heart rate in the 50s follow up outpatient with cardiology. Problem #4: hypertension: blood pressure on the lower side- felodipine discontinued; monitor blood pressure out patient. Problem #5: history of depression with chronic insomnia and anxiety- continue current  medications. Problem #6: tobacco use encouraged to quit tobacco.  Consults: cardiology and pulmonary/intensive care  Significant Diagnostic Studies: labs: blood chemistries normal potassium 4.5, WBC count 14,000 and radiology: CXR: CHF  Treatments: antibiotics: Levaquin and steroids: solu-medrol  Discharge Exam: Blood pressure 137/74, pulse 51, temperature 98.2 F (36.8 C), temperature source Oral, resp. rate 19, height 5\' 11"  (1.803 m), weight 113.3 kg (249 lb 12.5 oz), SpO2 95.00%. General appearance: alert Resp: wheezes bibasilar Cardio: regular rate and rhythm GI: soft, non-tender; bowel sounds normal; no masses,  no organomegaly Extremities: extremities normal, atraumatic, no cyanosis or edema  Disposition:  home with home health service  Discharge Orders   Future Appointments Provider Department Dept Phone   06/30/2013 9:15 AM Barbaraann Share, MD [facility] [phone]   Future Orders Complete By Expires   Diet - low sodium heart healthy  As directed    Discharge instructions  As directed    Comments:     O2  3 L [place] 24 hrs   Increase activity slowly  As directed        Medication List    STOP taking these medications       DAYQUIL MULTI-SYMPTOM COLD/FLU PO     felodipine 5 MG 24 hr tablet  Commonly known as:  PLENDIL     naproxen sodium 220 MG tablet  Commonly known as:  ANAPROX     NYQUIL COLD & FLU PO      TAKE these medications       albuterol (5 MG/ML) 0.5% nebulizer solution  Commonly known as:  PROVENTIL  Take 0.5 mLs (2.5 mg total) by nebulization every 6 (six) hours as needed for wheezing.     albuterol 108 (90 BASE) MCG/ACT inhaler  Commonly known as:  PROVENTIL HFA;VENTOLIN HFA  Inhale 2 puffs into the lungs every 6 (six) hours as needed for wheezing.  ALPRAZolam 0.25 MG tablet  Commonly known as:  XANAX  Take 1 tablet (0.25 mg total) by mouth 3 (three) times daily as needed for anxiety.     AMBIEN 10 MG tablet  Generic drug:   zolpidem  Take 5 mg by mouth at bedtime as needed.     ARIPiprazole 2 MG tablet  Commonly known as:  ABILIFY  Take 2 mg by mouth every morning.     aspirin EC 81 MG tablet  Take 81 mg by mouth daily.     carvedilol 12.5 MG tablet  Commonly known as:  COREG  Take 0.5 tablets (6.25 mg total) by mouth 2 (two) times daily with a meal. 1/2 tablet  Twice a day     fish oil-omega-3 fatty acids 1000 MG capsule  Take 2 g by mouth 2 (two) times daily.     ibuprofen 200 MG tablet  Commonly known as:  ADVIL,MOTRIN  Take 400 mg by mouth 2 (two) times daily.     MAGNESIUM PO  Take 1 tablet by mouth every evening.     NIACIN PO  Take 1 tablet by mouth 2 (two) times daily.     omeprazole 20 MG capsule  Commonly known as:  PRILOSEC  Take 20 mg by mouth daily.     predniSONE 20 MG tablet  Commonly known as:  DELTASONE  Take 3 tablets (60 mg total) by mouth daily with breakfast. 3 tablet for 3 days, then 2 tablet for 3 days, then 1 tablet for 3 days.     sertraline 100 MG tablet  Commonly known as:  ZOLOFT  Take 100 mg by mouth every morning.     thiamine 100 MG tablet  Commonly known as:  VITAMIN B-1  Take 100 mg by mouth daily.     tiotropium 18 MCG inhalation capsule  Commonly known as:  SPIRIVA  Place 1 capsule (18 mcg total) into inhaler and inhale daily.     valsartan 80 MG tablet  Commonly known as:  DIOVAN  Take 80 mg by mouth daily.     vitamin C 1000 MG tablet  Take 1,000 mg by mouth daily.           Follow-up Information   Follow up with Barbaraann Share, MD On 06/30/2013. (9:15am )    Specialty:  Pulmonary Disease   Contact information:   [address] [place] [address] [phone]       Follow up with Georgann Housekeeper, MD In 1 week.   Specialty:  Internal Medicine   Contact information:   [address] [place] [address] [phone]      Discharge planning time total 45 minutes Signed: Georgann Housekeeper 05/15/2013, 8:34  AM

## 2013-05-18 ENCOUNTER — Telehealth: Payer: Self-pay | Admitting: Pulmonary Disease

## 2013-05-18 NOTE — Telephone Encounter (Signed)
Will need PFTs as outpatient and f/u with sleep MD for ?OSA as outpatient. --  D/c does not mention anything about sleep studying being set up.   I called and made pt aware. Nothing further needed

## 2013-06-02 ENCOUNTER — Encounter: Payer: Self-pay | Admitting: Pulmonary Disease

## 2013-06-02 ENCOUNTER — Ambulatory Visit (INDEPENDENT_AMBULATORY_CARE_PROVIDER_SITE_OTHER): Payer: BC Managed Care – PPO | Admitting: Pulmonary Disease

## 2013-06-02 VITALS — BP 108/70 | HR 64 | Temp 97.5°F | Ht 70.25 in | Wt 247.0 lb

## 2013-06-02 DIAGNOSIS — G4730 Sleep apnea, unspecified: Secondary | ICD-10-CM

## 2013-06-02 DIAGNOSIS — G478 Other sleep disorders: Secondary | ICD-10-CM

## 2013-06-02 NOTE — Assessment & Plan Note (Signed)
The patient's history is very suggestive of sleep disorder breathing, and more than likely he has both obstructive and central components.  I have had a long discussion with him about the various forms of sleep apnea, and its impact to his cardiovascular health and quality of life.  He will need to have a sleep study at the sleep Center, and the patient is agreeable.

## 2013-06-02 NOTE — Patient Instructions (Signed)
Will schedule for a sleep study to evaluate for obstructive and central sleep apnea Work on weight loss Will see you back once the study is done to review with you.

## 2013-06-02 NOTE — Progress Notes (Signed)
  Subjective:    Patient ID: Robert York, male    DOB: 04/23/1951, 62 y.o.   MRN: [id number]  HPI  The patient is a 62 year old male who I was asked to see for possible obstructive sleep apnea.  The patient has been noted to have loud snoring, as well as witnessed apneas by his bed partner.  He denies frequent awakenings, and overall feels that he is rested in the mornings upon arising.  However, he admits to having inappropriate daytime sleepiness with inactivity, and will also doze in the evenings while watching television or movies.  He denies any sleepiness with driving.  The patient states that his weight is up 20 pounds over the last 2 years, and his Epworth score today is 13.    Sleep Questionnaire What time do you typically go to bed?( Between what hours) 10-11pm 10-11pm at 1140 on 06/02/13 by Maisie Fus, CMA How long does it take you to fall asleep?  at 1140 on 06/02/13 by Maisie Fus, CMA How many times during the night do you wake up? 1 1 at 1140 on 06/02/13 by Maisie Fus, CMA What time do you get out of bed to start your day? 0600 0600 at 1140 on 06/02/13 by Maisie Fus, CMA Do you drive or operate heavy machinery in your occupation? No No at 1140 on 06/02/13 by Maisie Fus, CMA How much has your weight changed (up or down) over the past two years? (In pounds) 20 lb (9.072 kg)20 lb (9.072 kg) decrease at 1140 on 06/02/13 by Maisie Fus, CMA Have you ever had a sleep study before? No No at 1140 on 06/02/13 by Maisie Fus, CMA Do you currently use CPAP? No No at 1140 on 06/02/13 by Maisie Fus, CMA Do you wear oxygen at any time? No No at 1140 on 06/02/13 by Maisie Fus, CMA   Review of Systems  Constitutional: Negative for fever and unexpected weight change.  HENT: Positive for congestion. Negative for dental problem, ear pain, nosebleeds, postnasal drip, rhinorrhea, sinus pressure, sneezing, sore throat and trouble  swallowing.   Eyes: Negative for redness and itching.  Respiratory: Positive for shortness of breath. Negative for cough, chest tightness and wheezing.   Cardiovascular: Negative for palpitations and leg swelling.  Gastrointestinal: Negative for nausea and vomiting.  Genitourinary: Negative for dysuria.  Musculoskeletal: Positive for arthralgias. Negative for joint swelling.  Skin: Negative for rash.  Neurological: Negative for headaches.  Hematological: Does not bruise/bleed easily.  Psychiatric/Behavioral: Negative for dysphoric mood. The patient is nervous/anxious.        Objective:   Physical Exam Constitutional: obese male, no acute distress  HENT:  Nares patent without discharge  Oropharynx without exudate, palate and uvula are moderately elongated.   Eyes:  Perrla, eomi, no scleral icterus  Neck:  No JVD, no TMG  Cardiovascular:  Normal rate, regular rhythm, no rubs or gallops.  No murmurs        Intact distal pulses  Pulmonary :  Mildly decreased breath sounds, no stridor or respiratory distress   No rales, rhonchi, or wheezing  Abdominal:  Soft, nondistended, bowel sounds present.  No tenderness noted.   Musculoskeletal:  minimal lower extremity edema noted.  Lymph Nodes:  No cervical lymphadenopathy noted  Skin:  No cyanosis noted  Neurologic:  Alert, appropriate, moves all 4 extremities without obvious deficit.         Assessment & Plan:

## 2013-06-30 ENCOUNTER — Institutional Professional Consult (permissible substitution): Payer: BC Managed Care – PPO | Admitting: Pulmonary Disease

## 2013-07-03 ENCOUNTER — Ambulatory Visit (HOSPITAL_BASED_OUTPATIENT_CLINIC_OR_DEPARTMENT_OTHER): Payer: BC Managed Care – PPO | Attending: Pulmonary Disease

## 2013-07-03 VITALS — Ht 70.0 in | Wt 250.0 lb

## 2013-07-03 DIAGNOSIS — G4761 Periodic limb movement disorder: Secondary | ICD-10-CM | POA: Insufficient documentation

## 2013-07-03 DIAGNOSIS — G4733 Obstructive sleep apnea (adult) (pediatric): Secondary | ICD-10-CM | POA: Insufficient documentation

## 2013-07-03 DIAGNOSIS — G4730 Sleep apnea, unspecified: Secondary | ICD-10-CM

## 2013-07-24 DIAGNOSIS — G478 Other sleep disorders: Secondary | ICD-10-CM

## 2013-07-24 NOTE — Sleep Study (Signed)
   NAME: Robert PoreDaniel L Zawistowski DATE OF BIRTH:  1950-09-28 MEDICAL RECORD NUMBER [id number]  LOCATION: [facility]  PHYSICIAN: Barbaraann ShareCLANCE,KEITH M  DATE OF STUDY: 07/03/2013  SLEEP STUDY TYPE: Nocturnal Polysomnogram               REFERRING PHYSICIAN: Clance, Maree KrabbeKeith M, MD  INDICATION FOR STUDY: Hypersomnia with sleep apnea  EPWORTH SLEEPINESS SCORE:  9 HEIGHT: 5\' 10"  (177.8 cm)  WEIGHT: 250 lb (113.399 kg)    Body mass index is 35.87 kg/(m^2).  NECK SIZE: 20 in.  SLEEP ARCHITECTURE: The patient had a total sleep time of 306 minutes, with no slow-wave sleep or REM. Sleep onset latency was mildly increased at 40 minutes, and sleep efficiency was mildly reduced at 80%.  RESPIRATORY DATA: The patient was found to have 13 apneas and 90 obstructive hypopneas, giving him an AHI of 20 events per hour. The events occurred primarily in the supine position, and there was loud snoring noted throughout.  OXYGEN DATA: There was oxygen desaturation as low as 81% with the patient's obstructive events  CARDIAC DATA: No clinically significant arrhythmias were noted  MOVEMENT/PARASOMNIA: The patient had 664 leg jerks, with 34 per hour resulting in arousal or awakening. There were no abnormal behaviors seen.  IMPRESSION/ RECOMMENDATION:    1) moderate obstructive sleep apnea/hypopnea syndrome, with an AHI of 20 events per hour and oxygen desaturation as low as 81%. Treatment for this degree of sleep apnea can include a trial of weight loss alone, upper airway surgery, dental appliance, and also CPAP. Clinical correlation is suggested.  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 It is unclear if this is related to his sleep disordered breathing, or whether he may have a primary movement disorder of sleep. Clinical correlation is suggested after treatment of his sleep disordered breathing.     Barbaraann ShareLANCE,KEITH M Diplomate, American Board of Sleep  Medicine  ELECTRONICALLY SIGNED ON:  07/24/2013, 9:24 AM  [facility] PH: [phone]   FX: [phone] ACCREDITED BY THE AMERICAN ACADEMY OF SLEEP MEDICINE

## 2013-08-10 ENCOUNTER — Encounter: Payer: Self-pay | Admitting: Pulmonary Disease

## 2013-08-10 ENCOUNTER — Ambulatory Visit (INDEPENDENT_AMBULATORY_CARE_PROVIDER_SITE_OTHER): Payer: BC Managed Care – PPO | Admitting: Pulmonary Disease

## 2013-08-10 VITALS — BP 138/88 | HR 58 | Temp 97.3°F | Ht 70.0 in | Wt 258.8 lb

## 2013-08-10 DIAGNOSIS — G4733 Obstructive sleep apnea (adult) (pediatric): Secondary | ICD-10-CM

## 2013-08-10 DIAGNOSIS — G2581 Restless legs syndrome: Secondary | ICD-10-CM

## 2013-08-10 MED ORDER — ROPINIROLE HCL 0.5 MG PO TABS: ORAL_TABLET | ORAL | Status: DC

## 2013-08-10 NOTE — Progress Notes (Signed)
   Subjective:    Patient ID: Robert York, male    DOB: [birth year], 63 y.o.   MRN: [id number]  HPI The patient comes in today for followup after his recent sleep study. He was found to have moderate obstructive sleep apnea with an AHI of 20 events per hour. He was also found to have 664 periodic limb movements, with 34 per hour resulting in arousal or awakening. He gives a classic history for the restless leg syndrome. I have reviewed the study with he and his family member in detail, and answered all of their questions.   Review of Systems  Constitutional: Negative for fever and unexpected weight change.  HENT: Negative for congestion, dental problem, ear pain, nosebleeds, postnasal drip, rhinorrhea, sinus pressure, sneezing, sore throat and trouble swallowing.   Eyes: Negative for redness and itching.  Respiratory: Negative for cough, chest tightness, shortness of breath and wheezing.   Cardiovascular: Negative for palpitations and leg swelling.  Gastrointestinal: Negative for nausea and vomiting.  Genitourinary: Negative for dysuria.  Musculoskeletal: Negative for joint swelling.  Skin: Negative for rash.  Neurological: Negative for headaches.  Hematological: Does not bruise/bleed easily.  Psychiatric/Behavioral: Negative for dysphoric mood. The patient is not nervous/anxious.        Objective:   Physical Exam Obese male in no acute distress Nose without purulence or discharge noted Neck without lymphadenopathy or thyromegaly Chest with mild rhonchi, otherwise clear Lower extremities with mild edema, no cyanosis Alert, does not appear to be sleepy, moves all 4 extremities.       Assessment & Plan:

## 2013-08-10 NOTE — Assessment & Plan Note (Signed)
The patient has been diagnosed with moderate obstructive sleep apnea by his recent sleep study. I have discussed a conservative treatment option with him that would consist of aggressive weight loss over the next 6 months. Alternatively, the more aggressive treatment would be a trial of CPAP while working on weight loss. After a long discussion with he and his family member, he has agreed to give CPAP a try. I will set the patient up on cpap at a moderate pressure level to allow for desensitization, and will troubleshoot the device over the next 4-6weeks if needed.  The pt is to call me if having issues with tolerance.  Will then optimize the pressure once patient is able to wear cpap on a consistent basis.

## 2013-08-10 NOTE — Patient Instructions (Signed)
Will start on requip 0.5mg .  Take one after dinner each night for one week, then increase to 2 after dinner thereafter.  Let me know if you continue to have kicking. Will start on cpap at a moderate pressure level.  Please call if you are having tolerance issues. Work on weight loss. followup with me in 8 weeks.

## 2013-08-10 NOTE — Assessment & Plan Note (Signed)
The patient has very large numbers of periodic limb movements on his sleep study, and has a classic history for the restless leg syndrome. We'll therefore start him on a dopamine agonist as a trial.

## 2013-10-05 ENCOUNTER — Encounter (INDEPENDENT_AMBULATORY_CARE_PROVIDER_SITE_OTHER): Payer: Self-pay

## 2013-10-05 ENCOUNTER — Encounter: Payer: Self-pay | Admitting: Pulmonary Disease

## 2013-10-05 ENCOUNTER — Ambulatory Visit (INDEPENDENT_AMBULATORY_CARE_PROVIDER_SITE_OTHER): Payer: BC Managed Care – PPO | Admitting: Pulmonary Disease

## 2013-10-05 VITALS — BP 128/82 | HR 61 | Temp 97.3°F | Ht 68.0 in | Wt 259.8 lb

## 2013-10-05 DIAGNOSIS — G4733 Obstructive sleep apnea (adult) (pediatric): Secondary | ICD-10-CM

## 2013-10-05 NOTE — Progress Notes (Signed)
   Subjective:    Patient ID: Robert York, male    DOB: 1951/05/21, 63 y.o.   MRN: [id number]  HPI The patient comes in today for followup of his obstructive sleep apnea. He is wearing CPAP compliantly, and has seen considerable improvement in his sleep and daytime energy level. He is having no issues with his pressure, but would like to change to a nasal mask. His download shows excellent compliance, and also good control of his apnea.   Review of Systems  Constitutional: Negative for fever and unexpected weight change.  HENT: Negative for congestion, dental problem, ear pain, nosebleeds, postnasal drip, rhinorrhea, sinus pressure, sneezing, sore throat and trouble swallowing.   Eyes: Negative for redness and itching.  Respiratory: Negative for cough, chest tightness, shortness of breath and wheezing.   Cardiovascular: Negative for palpitations and leg swelling.  Gastrointestinal: Negative for nausea and vomiting.  Genitourinary: Negative for dysuria.  Musculoskeletal: Negative for joint swelling.  Skin: Negative for rash.  Neurological: Negative for headaches.  Hematological: Does not bruise/bleed easily.  Psychiatric/Behavioral: Negative for dysphoric mood. The patient is not nervous/anxious.        Objective:   Physical Exam Overweight male in no acute distress Nose without purulence or discharge noted No skin breakdown or pressure necrosis from the CPAP mask Neck without lymphadenopathy or thyromegaly Lower extremities without edema, no cyanosis Alert and oriented, moves all 4 extremities.       Assessment & Plan:

## 2013-10-05 NOTE — Patient Instructions (Signed)
Will send order to get your machine set on auto, and will also order a nasal mask.  Work on weight loss followup with me again in 6mos.

## 2013-10-05 NOTE — Assessment & Plan Note (Signed)
The patient is doing very well on CPAP, and feels that his sleep is much improved as well as his daytime energy level. He would like to change from nasal pillows to a nasal mask, and I will send the appropriate order. I will also change his CPAP pressure to the auto setting. I've encouraged him to work aggressively on weight loss, and to followup with me again in 6 months.

## 2013-12-26 ENCOUNTER — Encounter (HOSPITAL_COMMUNITY): Payer: Self-pay | Admitting: Emergency Medicine

## 2013-12-26 ENCOUNTER — Emergency Department (HOSPITAL_COMMUNITY)
Admission: EM | Admit: 2013-12-26 | Discharge: 2013-12-26 | Disposition: A | Discharge status: Home / Self Care | Payer: BC Managed Care – PPO | Source: Home / Self Care | Attending: Family Medicine | Admitting: Family Medicine

## 2013-12-26 DIAGNOSIS — A779 Spotted fever, unspecified: Secondary | ICD-10-CM

## 2013-12-26 DIAGNOSIS — A770 Spotted fever due to Rickettsia rickettsii: Secondary | ICD-10-CM

## 2013-12-26 MED ORDER — DOXYCYCLINE HYCLATE 100 MG PO CAPS: 100.0000 mg | ORAL_CAPSULE | Freq: Two times a day (BID) | ORAL | Status: DC

## 2013-12-26 NOTE — Discharge Instructions (Signed)
Thank you for coming in today. Return if worse.  Follow up with your doctor.   [place] Spotted Fever [place] Spotted Fever (RMSF) is the oldest known tick-borne disease of people in the [place]. This disease was named because it was first described among people in the [facility] [place] area who had an illness characterized by a rash with red-purple-black spots. This disease is caused by a rickettsia (Rickettsia rickettsii), a bacteria carried by the tick. The [facility] wood tick and the American dog tick, acquire and transmit the RMSF bacteria (pictures NOT actual size). When a larval, nymphal or adult tick feeds on an infected rodent or larger animal, the tick can become infected. Infected adult ticks then feed on people who may then get RMSF. The tick transmits the disease to humans during a prolonged period of feeding that lasts many hours, days or even a couple weeks. The bite is painless and frequently goes unnoticed. An infected male tick may also pass the rickettsial bacteria to her eggs that then may mature to be infected adult ticks. The rickettsia that causes RMSF can also get into a person's body through damaged skin. A tick bite is not necessary. People can get RMSF if they crush a tick and get it's blood or body fluids on their skin through a small cut or sore.  DIAGNOSIS Diagnosis is made by laboratory tests.  TREATMENT Treatment is with antibiotics (medications that kill rickettsia and other bacteria). Immediate treatment usually prevents death. GEOGRAPHIC RANGE This disease was reported only in the [facility] [place] until 1931. RMSF has more recently been described among individuals in all states except [place], [place] and [place]. The highest reported incidences of RMSF now occur among residents of [place], [place], [place] and [address] [place]. TIME OF YEAR  Most cases are diagnosed during late spring and summer when ticks are most active. However, especially in  the warmer [place], a few cases occur during the winter. SYMPTOMS   Symptoms of RMSF begin from 2 to 14 days after a tick bite. The most common early symptoms are fever, muscle aches and headache followed by nausea (feeling sick to your stomach) or vomiting.  The RMSF rash is typically delayed until 3 or more days after symptom onset, and eventually develops in 9 of 10 infected patients by the 5th day of illness. If the disease is not treated it can cause death. If you get a fever, headache, muscle aches, rash, nausea or vomiting within 2 weeks of a possible tick bite or exposure you should see your caregiver immediately. PREVENTION Ticks prefer to hide in shady, moist ground litter. They can often be found above the ground clinging to tall grass, brush, shrubs and low tree branches. They also inhabit lawns and gardens, especially at the edges of woodlands and around old stone walls. Within the areas where ticks generally live, no naturally vegetated area can be considered completely free of infected ticks. The best precaution against RMSF is to avoid contact with soil, leaf litter and vegetation as much as possible in tick infested areas. For those who enjoy gardening or walking in their yards, clear brush and mow tall grass around houses and at the edges of gardens. This may help reduce the tick population in the immediate area. Applications of chemical insecticides by a licensed professional in the spring (late May) and Fall (September) will also control ticks, especially in heavily infested areas. Treatment will never get rid of all the ticks. Getting rid of  small animal populations that host ticks will also decrease the tick population. When working in the garden, [employer], or handling soil and vegetation, wear light-colored protective clothing and gloves. Spot-check often to prevent ticks from reaching the skin. Ticks cannot jump or fly. They will not drop from an above-ground perch onto  a passing animal. Once a tick gains access to human skin it climbs upward until it reaches a more protected area. For example, the back of the knee, groin, navel, armpit, ears or nape of the neck. It then begins the slow process of embedding itself in the skin. Campers, hikers, field workers, and others who spend time in wooded, brushy or tall grassy areas can avoid exposure to ticks by using the following precautions:  Wear light-colored clothing with a tight weave to spot ticks more easily and prevent contact with the skin.  Wear long pants tucked into socks, long-sleeved shirts tucked into pants and enclosed shoes or boots along with insect repellent.  Spray clothes with insect repellent containing either DEET or Permethrin. Only DEET can be used on exposed skin. Follow the manufacturer's directions carefully.  Wear a hat and keep long hair pulled back.  Stay on cleared, well-worn trails whenever possible.  Spot-check yourself and others often for the presence of ticks on clothes. If you find one, there are likely to be others. Check thoroughly.  Remove clothes after leaving tick-infested areas. If possible, wash them to eliminate any unseen ticks. Check yourself, your children and any pets from head to toe for the presence of ticks.  Shower and shampoo. You can greatly reduce your chances of contracting RMSF if you remove attached ticks as soon as possible. Regular checks of the body, including all body sites covered by hair (head, armpits, genitals), allow removal of the tick before rickettsial transmission. To remove an attached tick, use a forceps or tweezers to detach the intact tick without leaving mouth parts in the skin. The tick bite wound should be cleansed after tick removal. Remember the most common symptoms of RMSF are fever, muscle aches, headache and nausea or vomiting with a later onset of rash. If you get these symptoms after a tick bite and while living in an area where RMSF  is found, RMSF should be suspected. If the disease is not treated, it can cause death. See your caregiver immediately if you get these symptoms. Do this even if not aware of a tick bite. Document Released: 10/21/2000 Document Revised: 10/01/2011 Document Reviewed: 06/13/2009 [facility] Patient Information 2014 [place], [place].

## 2013-12-26 NOTE — ED Provider Notes (Signed)
Robert York is a 63 y.o. male who presents to Urgent Care today for tick bite. Patient was bitten by a tick on the left back 4 days ago. He developed a petechial rash on his bilateral lower extremities a few days later. He notes feeling mild fatigue and body aches but denies any fever. History aspirin which has helped. He feels well otherwise. No nausea vomiting or diarrhea. No trouble breathing.   Past Medical History  Diagnosis Date  . Hyperlipidemia   . Hypertension   . Allergic rhinitis due to pollen   . Ventricular fibrillation 2011    associated with hypokalemia  . Respiratory distress, acute   . Hypernatremia   . MRSA (methicillin resistant staphylococcus aureus) pneumonia   . Thrombocytopenia   . DVT (deep venous thrombosis)     left calf and left greater saphenous vein   . COPD (chronic obstructive pulmonary disease)   . Alcohol dependence     history of  . Anxiety disorder   . Aortic root enlargement 02/04/2012    Echo 2013 46 mm 2012 45 mm    History  Substance Use Topics  . Smoking status: Former Smoker -- 2.00 packs/day for 30 years    Types: Cigarettes    Quit date: 11/20/2005  . Smokeless tobacco: Never Used  . Alcohol Use: 0.0 oz/week    3-4 Glasses of wine per week   ROS as above Medications: No current facility-administered medications for this encounter.   Current Outpatient Prescriptions  Medication Sig Dispense Refill  . ALPRAZolam (XANAX) 0.25 MG tablet Take 1 tablet (0.25 mg total) by mouth 3 (three) times daily as needed for anxiety.  30 tablet  0  . aspirin EC 81 MG tablet Take 81 mg by mouth daily.      . carvedilol (COREG) 12.5 MG tablet Take 0.5 tablets (6.25 mg total) by mouth 2 (two) times daily with a meal. 1/2 tablet  Twice a day  60 tablet  11  . fish oil-omega-3 fatty acids 1000 MG capsule Take 2 g by mouth 2 (two) times daily.       [name] MAGNESIUM PO Take 1 tablet by mouth every evening.      [name] NIACIN PO Take 1 tablet by mouth 2 (two) times  daily.       [name] omeprazole (PRILOSEC) 20 MG capsule Take 20 mg by mouth daily.       . sertraline (ZOLOFT) 100 MG tablet Take 100 mg by mouth every morning.      . Thiamine HCl (VITAMIN B-1) 100 MG tablet Take 100 mg by mouth daily.        . valsartan (DIOVAN) 80 MG tablet Take 80 mg by mouth daily.      [name] zolpidem (AMBIEN) 10 MG tablet Take 5 mg by mouth at bedtime as needed.       [name] albuterol (PROVENTIL HFA;VENTOLIN HFA) 108 (90 BASE) MCG/ACT inhaler Inhale 2 puffs into the lungs every 6 (six) hours as needed for wheezing.      [name] doxycycline (VIBRAMYCIN) 100 MG capsule Take 1 capsule (100 mg total) by mouth 2 (two) times daily.  14 capsule  0    Exam:  BP 146/81  Pulse 59  Temp(Src) 97.8 F (36.6 C) (Oral)  Resp 17  SpO2 97% Gen: Well NAD HEENT: EOMI,  MMM Lungs: Normal work of breathing. CTABL Heart: RRR no MRG Abd: NABS, Soft. NT, ND Exts: Brisk capillary refill, warm and well perfused.  Skin; quarter-sized  erythematous area left back.  Occasional small scattered erythematous petechial lesions bilateral extremities. Approximately 10  No results found for this or any previous visit (from the past 24 hour(s)). No results found.  Assessment and Plan: 63 y.o. male with tick bite with rash and mild systemic symptoms. This is concerning for [facility] [place] [place] spotted fever. Plan to start empiric therapy with doxycycline. It is really too early for serology.  Plan to followup with primary care provider.  Discussed warning signs or symptoms. Please see discharge instructions. Patient expresses understanding.    Rodolph BongEvan S Corey, MD 12/26/13 1323

## 2013-12-26 NOTE — ED Notes (Signed)
Reports tick bite to left lower back.   Noticed 4 days. Ago.   Rash on lower right and left leg.   Pt has used otc cortisone with mild relief.

## 2014-03-30 DIAGNOSIS — Z0000 Encounter for general adult medical examination without abnormal findings: Secondary | ICD-10-CM | POA: Diagnosis not present

## 2014-03-30 DIAGNOSIS — Z23 Encounter for immunization: Secondary | ICD-10-CM | POA: Diagnosis not present

## 2014-03-30 DIAGNOSIS — I10 Essential (primary) hypertension: Secondary | ICD-10-CM | POA: Diagnosis not present

## 2014-03-30 DIAGNOSIS — J449 Chronic obstructive pulmonary disease, unspecified: Secondary | ICD-10-CM | POA: Diagnosis not present

## 2014-03-30 DIAGNOSIS — R7309 Other abnormal glucose: Secondary | ICD-10-CM | POA: Diagnosis not present

## 2014-03-30 DIAGNOSIS — N400 Enlarged prostate without lower urinary tract symptoms: Secondary | ICD-10-CM | POA: Diagnosis not present

## 2014-03-30 DIAGNOSIS — F329 Major depressive disorder, single episode, unspecified: Secondary | ICD-10-CM | POA: Diagnosis not present

## 2014-03-30 DIAGNOSIS — E7800 Pure hypercholesterolemia, unspecified: Secondary | ICD-10-CM | POA: Diagnosis not present

## 2014-03-30 DIAGNOSIS — E669 Obesity, unspecified: Secondary | ICD-10-CM | POA: Diagnosis not present

## 2014-03-30 DIAGNOSIS — Z1331 Encounter for screening for depression: Secondary | ICD-10-CM | POA: Diagnosis not present

## 2014-03-30 DIAGNOSIS — G4733 Obstructive sleep apnea (adult) (pediatric): Secondary | ICD-10-CM | POA: Diagnosis not present

## 2014-04-07 ENCOUNTER — Ambulatory Visit: Payer: BC Managed Care – PPO | Admitting: Pulmonary Disease

## 2014-04-08 ENCOUNTER — Ambulatory Visit (INDEPENDENT_AMBULATORY_CARE_PROVIDER_SITE_OTHER): Payer: Medicare Other | Admitting: Pulmonary Disease

## 2014-04-08 ENCOUNTER — Encounter: Payer: Self-pay | Admitting: Pulmonary Disease

## 2014-04-08 VITALS — BP 122/68 | HR 65 | Temp 98.1°F | Ht 69.0 in | Wt 261.4 lb

## 2014-04-08 DIAGNOSIS — G4733 Obstructive sleep apnea (adult) (pediatric): Secondary | ICD-10-CM | POA: Diagnosis not present

## 2014-04-08 NOTE — Assessment & Plan Note (Signed)
The patient is doing very well on CPAP by his download, and also from a symptom standpoint. I have asked him to continue on his device, keep up with his mask changes and supplies, and to work aggressively on weight loss. I have also asked him to look at some of the newer nasal pillows mask, and let me know if he would like to try.

## 2014-04-08 NOTE — Progress Notes (Signed)
   Subjective:    Patient ID: Robert York, male    DOB: Sep 24, 1950, 63 y.o.   MRN: [id number]  HPI The patient comes in today for followup of his obstructive sleep apnea. He is wearing CPAP compliantly by his download, and has excellent control of his AHI. He is having a little bit of mask leak, and states his current nasal pillows can irritate his nose. Of note, his weight is stable from the last visit.   Review of Systems  Constitutional: Negative for fever and unexpected weight change.  HENT: Negative for congestion, dental problem, ear pain, nosebleeds, postnasal drip, rhinorrhea, sinus pressure, sneezing, sore throat and trouble swallowing.   Eyes: Negative for redness and itching.  Respiratory: Negative for cough, chest tightness, shortness of breath and wheezing.   Cardiovascular: Negative for palpitations and leg swelling.  Gastrointestinal: Negative for nausea and vomiting.  Genitourinary: Negative for dysuria.  Musculoskeletal: Negative for joint swelling.  Skin: Negative for rash.  Neurological: Negative for headaches.  Hematological: Does not bruise/bleed easily.  Psychiatric/Behavioral: Negative for dysphoric mood. The patient is not nervous/anxious.        Objective:   Physical Exam Overweight male in no acute distress Nose without purulence or discharge noted Neck without lymphadenopathy or thyromegaly No skin breakdown or pressure necrosis from the CPAP mask Lower extremities without edema, no cyanosis Alert and oriented, moves all 4 extremities.       Assessment & Plan:

## 2014-04-08 NOTE — Patient Instructions (Signed)
Continue on cpap, you are doing well Have your supply company show you the "dreamwear mask" by [employer] Keep working on weight loss followup with me again in one year, but call if having issues.

## 2014-06-24 ENCOUNTER — Encounter: Payer: Self-pay | Admitting: *Deleted

## 2014-06-28 ENCOUNTER — Encounter: Payer: Self-pay | Admitting: Internal Medicine

## 2014-06-28 ENCOUNTER — Ambulatory Visit (INDEPENDENT_AMBULATORY_CARE_PROVIDER_SITE_OTHER): Payer: Medicare Other | Admitting: Internal Medicine

## 2014-06-28 VITALS — BP 112/72 | HR 63 | Ht 71.0 in | Wt 258.2 lb

## 2014-06-28 DIAGNOSIS — I469 Cardiac arrest, cause unspecified: Secondary | ICD-10-CM

## 2014-06-28 DIAGNOSIS — I77819 Aortic ectasia, unspecified site: Secondary | ICD-10-CM | POA: Diagnosis not present

## 2014-06-28 DIAGNOSIS — I7789 Other specified disorders of arteries and arterioles: Secondary | ICD-10-CM

## 2014-06-28 NOTE — Progress Notes (Signed)
Patient Care Team: Georgann HousekeeperKarrar Husain, MD as PCP - General (Internal Medicine)   HPI  Robert York is a 63 y.o. male Seen in followup for cardiac arrest in the setting of hypokalemia and alcohol.   He has normalization of left ventricular systolic function.    He is actively involved in AA.he has been sober since 2011.[name]. He is doing really quite well.he is not exercising.   He snores and is using CPAP He had a negative catheterization 2011 The patient denies chest pain, shortness of breath, nocturnal dyspnea, orthopnea or peripheral edema.  There have been no palpitations, lightheadedness or syncope.    Major limitation is knees  Past Medical History  Diagnosis Date  . Hyperlipidemia   . Hypertension   . Allergic rhinitis due to pollen   . Ventricular fibrillation 2011    associated with hypokalemia  . Respiratory distress, acute   . Hypernatremia   . MRSA (methicillin resistant staphylococcus aureus) pneumonia   . Thrombocytopenia   . DVT (deep venous thrombosis)     left calf and left greater saphenous vein   . COPD (chronic obstructive pulmonary disease)   . Alcohol dependence     history of  . Anxiety disorder   . Aortic root enlargement 02/04/2012    Echo 2013 46 mm 2012 45 mm     Past Surgical History  Procedure Laterality Date  . Back surgery    . Tonsillectomy    . Hernia repair      Current Outpatient Prescriptions  Medication Sig Dispense Refill  . albuterol (PROVENTIL HFA;VENTOLIN HFA) 108 (90 BASE) MCG/ACT inhaler Inhale 2 puffs into the lungs every 6 (six) hours as needed for wheezing.    [name]. ALPRAZolam (XANAX) 0.25 MG tablet Take 1 tablet (0.25 mg total) by mouth 3 (three) times daily as needed for anxiety. 30 tablet 0  . aspirin EC 81 MG tablet Take 81 mg by mouth daily.    . carvedilol (COREG) 12.5 MG tablet Take 0.5 tablets (6.25 mg total) by mouth 2 (two) times daily with a meal. 1/2 tablet  Twice a day 60 tablet 11  . fish oil-omega-3 fatty acids 1000 MG  capsule Take 2 g by mouth 2 (two) times daily.     [name]. MAGNESIUM PO Take 1 tablet by mouth every evening.    [name]. NIACIN PO Take 1 tablet by mouth 2 (two) times daily.     [name]. omeprazole (PRILOSEC) 20 MG capsule Take 20 mg by mouth daily.     . sertraline (ZOLOFT) 100 MG tablet Take 100 mg by mouth every morning.    . Thiamine HCl (VITAMIN B-1) 100 MG tablet Take 100 mg by mouth daily.      . valsartan (DIOVAN) 80 MG tablet Take 80 mg by mouth daily.    [name]. zolpidem (AMBIEN) 10 MG tablet Take 5 mg by mouth at bedtime as needed.      No current facility-administered medications for this visit.    Allergies  Allergen Reactions  . Lisinopril Other (See Comments)    Makes COPD worse, rare reaction-per patient.    . Amoxicillin Itching and Rash    Review of Systems negative except from HPI and PMH  Physical Exam BP 112/72 mmHg  Pulse 63  Ht 5\' 11"  (1.803 m)  Wt 258 lb 3.2 oz (117.119 kg)  BMI 36.03 kg/m2 Well developed and nourished in no acute distress HENT normal Neck supple with JVP-flat Clear Regular rate and rhythm, no  murmurs or gallops Abd-soft with active BS profoud obesity No Clubbing cyanosis edema Skin-warm and dry A & Oriented  Grossly normal sensory and motor function   Electrocardiogram demonstrates sinus rhythm at 59 Intervals 23/11/40  Assessment and  Plan  Aboarted Cardiac arrest  Hypokalemia  resolvoed  Recovering alcoholism  Aortic root enlargement   Hypertesnion  He also asks as to whether he could donate a kidney to a friend   Blood pressure is well-controlled. We'll leave him on current medications.  We will repeat his echo to look at his aortic root.  His blood work is being [facility] and frozen potassium issues are moot  He is sober now for 4 years and 9 months

## 2014-06-28 NOTE — Patient Instructions (Signed)
Your physician recommends that you continue on your current medications as directed. Please refer to the Current Medication list given to you today.  Your physician has requested that you have an echocardiogram. Echocardiography is a painless test that uses sound waves to create images of your heart. It provides your doctor with information about the size and shape of your heart and how well your heart's chambers and valves are working. This procedure takes approximately one hour. There are no restrictions for this procedure.  Your physician wants you to follow-up in: 2 years with Dr. Graciela HusbandsKlein.  You will receive a reminder letter in the mail two months in advance. If you don't receive a letter, please call our office to schedule the follow-up appointment.

## 2014-06-30 ENCOUNTER — Telehealth: Payer: Self-pay | Admitting: General Practice

## 2014-06-30 NOTE — Telephone Encounter (Signed)
New Message       Patient returning Robert York's call

## 2014-06-30 NOTE — Telephone Encounter (Signed)
Informed patient's girlfriend (pt is in bed with flu) that we will be contacting him to arrange echo. He understands.

## 2014-07-13 ENCOUNTER — Emergency Department (INDEPENDENT_AMBULATORY_CARE_PROVIDER_SITE_OTHER)
Admission: EM | Admit: 2014-07-13 | Discharge: 2014-07-13 | Disposition: A | Discharge status: Home / Self Care | Payer: Medicare Other | Source: Home / Self Care | Attending: Family Medicine | Admitting: Family Medicine

## 2014-07-13 ENCOUNTER — Encounter (HOSPITAL_COMMUNITY): Payer: Self-pay | Admitting: Emergency Medicine

## 2014-07-13 DIAGNOSIS — M10072 Idiopathic gout, left ankle and foot: Secondary | ICD-10-CM

## 2014-07-13 DIAGNOSIS — M109 Gout, unspecified: Secondary | ICD-10-CM

## 2014-07-13 MED ORDER — INDOMETHACIN 25 MG PO CAPS: 25.0000 mg | ORAL_CAPSULE | Freq: Three times a day (TID) | ORAL | Status: DC | PRN

## 2014-07-13 MED ORDER — COLCHICINE 0.6 MG PO TABS: ORAL_TABLET | ORAL | Status: DC

## 2014-07-13 NOTE — Discharge Instructions (Signed)
Please limit caffeine intake and make sure your are drinking 8-10, 8oz glasses of water each day.  Gout Gout is an inflammatory arthritis caused by a buildup of uric acid crystals in the joints. Uric acid is a chemical that is normally present in the blood. When the level of uric acid in the blood is too high it can form crystals that deposit in your joints and tissues. This causes joint redness, soreness, and swelling (inflammation). Repeat attacks are common. Over time, uric acid crystals can form into masses (tophi) near a joint, destroying bone and causing disfigurement. Gout is treatable and often preventable. CAUSES  The disease begins with elevated levels of uric acid in the blood. Uric acid is produced by your body when it breaks down a naturally found substance called purines. Certain foods you eat, such as meats and fish, contain high amounts of purines. Causes of an elevated uric acid level include:  Being passed down from parent to child (heredity).  Diseases that cause increased uric acid production (such as obesity, psoriasis, and certain cancers).  Excessive alcohol use.  Diet, especially diets rich in meat and seafood.  Medicines, including certain cancer-fighting medicines (chemotherapy), water pills (diuretics), and aspirin.  Chronic kidney disease. The kidneys are no longer able to remove uric acid well.  Problems with metabolism. Conditions strongly associated with gout include:  Obesity.  High blood pressure.  High cholesterol.  Diabetes. Not everyone with elevated uric acid levels gets gout. It is not understood why some people get gout and others do not. Surgery, joint injury, and eating too much of certain foods are some of the factors that can lead to gout attacks. SYMPTOMS   An attack of gout comes on quickly. It causes intense pain with redness, swelling, and warmth in a joint.  Fever can occur.  Often, only one joint is involved. Certain joints are more  commonly involved:  Base of the big toe.  Knee.  Ankle.  Wrist.  Finger. Without treatment, an attack usually goes away in a few days to weeks. Between attacks, you usually will not have symptoms, which is different from many other forms of arthritis. DIAGNOSIS  Your caregiver will suspect gout based on your symptoms and exam. In some cases, tests may be recommended. The tests may include:  Blood tests.  Urine tests.  X-rays.  Joint fluid exam. This exam requires a needle to remove fluid from the joint (arthrocentesis). Using a microscope, gout is confirmed when uric acid crystals are seen in the joint fluid. TREATMENT  There are two phases to gout treatment: treating the sudden onset (acute) attack and preventing attacks (prophylaxis).  Treatment of an Acute Attack.  Medicines are used. These include anti-inflammatory medicines or steroid medicines.  An injection of steroid medicine into the affected joint is sometimes necessary.  The painful joint is rested. Movement can worsen the arthritis.  You may use warm or cold treatments on painful joints, depending which works best for you.  Treatment to Prevent Attacks.  If you suffer from frequent gout attacks, your caregiver may advise preventive medicine. These medicines are started after the acute attack subsides. These medicines either help your kidneys eliminate uric acid from your body or decrease your uric acid production. You may need to stay on these medicines for a very long time.  The early phase of treatment with preventive medicine can be associated with an increase in acute gout attacks. For this reason, during the first few months of treatment,  your caregiver may also advise you to take medicines usually used for acute gout treatment. Be sure you understand your caregiver's directions. Your caregiver may make several adjustments to your medicine dose before these medicines are effective.  Discuss dietary treatment  with your caregiver or dietitian. Alcohol and drinks high in sugar and fructose and foods such as meat, poultry, and seafood can increase uric acid levels. Your caregiver or dietitian can advise you on drinks and foods that should be limited. HOME CARE INSTRUCTIONS   Do not take aspirin to relieve pain. This raises uric acid levels.  Only take over-the-counter or prescription medicines for pain, discomfort, or fever as directed by your caregiver.  Rest the joint as much as possible. When in bed, keep sheets and blankets off painful areas.  Keep the affected joint raised (elevated).  Apply warm or cold treatments to painful joints. Use of warm or cold treatments depends on which works best for you.  Use crutches if the painful joint is in your leg.  Drink enough fluids to keep your urine clear or pale yellow. This helps your body get rid of uric acid. Limit alcohol, sugary drinks, and fructose drinks.  Follow your dietary instructions. Pay careful attention to the amount of protein you eat. Your daily diet should emphasize fruits, vegetables, whole grains, and fat-free or low-fat milk products. Discuss the use of coffee, vitamin C, and cherries with your caregiver or dietitian. These may be helpful in lowering uric acid levels.  Maintain a healthy body weight. SEEK MEDICAL CARE IF:   You develop diarrhea, vomiting, or any side effects from medicines.  You do not feel better in 24 hours, or you are getting worse. SEEK IMMEDIATE MEDICAL CARE IF:   Your joint becomes suddenly more tender, and you have chills or a fever. MAKE SURE YOU:   Understand these instructions.  Will watch your condition.  Will get help right away if you are not doing well or get worse. Document Released: 07/06/2000 Document Revised: 11/23/2013 Document Reviewed: 02/20/2012 [facility] Patient Information 2015 [place], [place]. This information is not intended to replace advice given to you by your health care  provider. Make sure you discuss any questions you have with your health care provider.  Low-Purine Diet Purines are compounds that affect the level of uric acid in your body. A low-purine diet is a diet that is low in purines. Eating a low-purine diet can prevent the level of uric acid in your body from getting too high and causing gout or kidney stones or both. WHAT DO I NEED TO KNOW ABOUT THIS DIET?  Choose low-purine foods. Examples of low-purine foods are listed in the next section.  Drink plenty of fluids, especially water. Fluids can help remove uric acid from your body. Try to drink 8-16 cups (1.9-3.8 L) a day.  Limit foods high in fat, especially saturated fat, as fat makes it harder for the body to get rid of uric acid. Foods high in saturated fat include pizza, cheese, ice cream, whole milk, fried foods, and gravies. Choose foods that are lower in fat and lean sources of protein. Use olive oil when cooking as it contains healthy fats that are not high in saturated fat.  Limit alcohol. Alcohol interferes with the elimination of uric acid from your body. If you are having a gout attack, avoid all alcohol.  Keep in mind that different people's bodies react differently to different foods. You will probably learn over time which foods do  or do not affect you. If you discover that a food tends to cause your gout to flare up, avoid eating that food. You can more freely enjoy foods that do not cause problems. If you have any questions about a food item, talk to your dietitian or health care provider. WHICH FOODS ARE LOW, MODERATE, AND HIGH IN PURINES? The following is a list of foods that are low, moderate, and high in purines. You can eat any amount of the foods that are low in purines. You may be able to have small amounts of foods that are moderate in purines. Ask your health care provider how much of a food moderate in purines you can have. Avoid foods high in purines. Grains  Foods low in  purines: Enriched white bread, pasta, rice, cake, cornbread, popcorn.  Foods moderate in purines: Whole-grain breads and cereals, wheat germ, bran, oatmeal. Uncooked oatmeal. Dry wheat bran or wheat germ.  Foods high in purines: Pancakes, [place] toast, biscuits, muffins. Vegetables  Foods low in purines: All vegetables, except those that are moderate in purines.  Foods moderate in purines: Asparagus, cauliflower, spinach, mushrooms, green peas. Fruits  All fruits are low in purines. Meats and other Protein Foods  Foods low in purines: Eggs, nuts, peanut butter.  Foods moderate in purines: 80-90% lean beef, lamb, veal, pork, poultry, fish, eggs, peanut butter, nuts. Crab, lobster, oysters, and shrimp. Cooked dried beans, peas, and lentils.  Foods high in purines: Anchovies, sardines, herring, mussels, tuna, codfish, scallops, trout, and haddock. Tomasa BlaseBacon. Organ meats (such as liver or kidney). Tripe. Game meat. Goose. Sweetbreads. Dairy  All dairy foods are low in purines. Low-fat and fat-free dairy products are best because they are low in saturated fat. Beverages  Drinks low in purines: Water, carbonated beverages, tea, coffee, cocoa.  Drinks moderate in purines: Soft drinks and other drinks sweetened with high-fructose corn syrup. Juices. To find whether a food or drink is sweetened with high-fructose corn syrup, look at the ingredients list.  Drinks high in purines: Alcoholic beverages (such as beer). Condiments  Foods low in purines: Salt, herbs, olives, pickles, relishes, vinegar.  Foods moderate in purines: Butter, margarine, oils, mayonnaise. Fats and Oils  Foods low in purines: All types, except gravies and sauces made with meat.  Foods high in purines: Gravies and sauces made with meat. Other Foods  Foods low in purines: Sugars, sweets, gelatin. Cake. Soups made without meat.  Foods moderate in purines: Meat-based or fish-based soups, broths, or bouillons. Foods and  drinks sweetened with high-fructose corn syrup.  Foods high in purines: High-fat desserts (such as ice cream, cookies, cakes, pies, doughnuts, and chocolate). Contact your dietitian for more information on foods that are not listed here. Document Released: 11/03/2010 Document Revised: 07/14/2013 Document Reviewed: 06/15/2013 [facility] Patient Information 2015 [place], [place]. This information is not intended to replace advice given to you by your health care provider. Make sure you discuss any questions you have with your health care provider.

## 2014-07-13 NOTE — ED Provider Notes (Signed)
CSN: [id number]     Arrival date & time 07/13/14  [id number] History   First MD Initiated Contact with Patient 07/13/14 1008     Chief Complaint  Patient presents with  . Foot Pain   (Consider location/radiation/quality/duration/timing/severity/associated sxs/prior Treatment) HPI Comments: Patient presents for left lateral ankle pain and swelling the began a few days ago without known injury. He states this condition occurs about once every 5-6 months and will often resolve spontaneously at home over about 3 days. He decided that when this episode began he would seek evaluation. PCP: Dr. Donette LarryHusain.   The history is provided by the patient.    Past Medical History  Diagnosis Date  . Hyperlipidemia   . Hypertension   . Allergic rhinitis due to pollen   . Ventricular fibrillation 2011    associated with hypokalemia  . Respiratory distress, acute   . Hypernatremia   . MRSA (methicillin resistant staphylococcus aureus) pneumonia   . Thrombocytopenia   . DVT (deep venous thrombosis)     left calf and left greater saphenous vein   . COPD (chronic obstructive pulmonary disease)   . Alcohol dependence     history of  . Anxiety disorder   . Aortic root enlargement 02/04/2012    Echo 2013 46 mm 2012 45 mm    Past Surgical History  Procedure Laterality Date  . Back surgery    . Tonsillectomy    . Hernia repair     Family History  Problem Relation Age of Onset  . Allergies Paternal Grandfather   . Asthma Father   . Heart disease Paternal Grandfather   . Breast cancer Mother    History  Substance Use Topics  . Smoking status: Former Smoker -- 2.00 packs/day for 30 years    Types: Cigarettes    Quit date: 11/20/2005  . Smokeless tobacco: Never Used  . Alcohol Use: 0.0 oz/week    3-4 Glasses of wine per week    Review of Systems  All other systems reviewed and are negative.   Allergies  Lisinopril and Amoxicillin  Home Medications   Prior to Admission medications     Medication Sig Start Date End Date Taking? Authorizing Provider  ARIPiprazole (ABILIFY PO) Take by mouth.   Yes Historical Provider, MD  aspirin EC 81 MG tablet Take 81 mg by mouth daily.   Yes Historical Provider, MD  carvedilol (COREG) 12.5 MG tablet Take 0.5 tablets (6.25 mg total) by mouth 2 (two) times daily with a meal. 1/2 tablet  Twice a day 05/14/13  Yes Georgann HousekeeperKarrar Husain, MD  fish oil-omega-3 fatty acids 1000 MG capsule Take 2 g by mouth 2 (two) times daily.    Yes Historical Provider, MD  MAGNESIUM PO Take 1 tablet by mouth every evening.   Yes Historical Provider, MD  NIACIN PO Take 1 tablet by mouth 2 (two) times daily.    Yes Historical Provider, MD  omeprazole (PRILOSEC) 20 MG capsule Take 20 mg by mouth daily.    Yes Historical Provider, MD  sertraline (ZOLOFT) 100 MG tablet Take 100 mg by mouth every morning.   Yes Historical Provider, MD  Thiamine HCl (VITAMIN B-1) 100 MG tablet Take 100 mg by mouth daily.     Yes Historical Provider, MD  valsartan (DIOVAN) 80 MG tablet Take 80 mg by mouth daily.   Yes Historical Provider, MD  albuterol (PROVENTIL HFA;VENTOLIN HFA) 108 (90 BASE) MCG/ACT inhaler Inhale 2 puffs into the lungs every 6 (six) hours  as needed for wheezing.    Historical Provider, MD  ALPRAZolam Prudy Feeler(XANAX) 0.25 MG tablet Take 1 tablet (0.25 mg total) by mouth 3 (three) times daily as needed for anxiety. 05/15/13   Georgann HousekeeperKarrar Husain, MD  colchicine 0.6 MG tablet Take two tablets by mouth at once and then a third tablet one hour later 07/13/14   Ria ClockJennifer Lee H Kymari Nuon, PA  indomethacin (INDOCIN) 25 MG capsule Take 1 capsule (25 mg total) by mouth 3 (three) times daily as needed for mild pain or moderate pain. Please stay well hydrated while taking this medication 07/13/14   Ria ClockJennifer Lee H Curry Dulski, PA  zolpidem (AMBIEN) 10 MG tablet Take 5 mg by mouth at bedtime as needed.     Historical Provider, MD   BP 129/86 mmHg  Pulse 96  Temp(Src) 98 F (36.7 C) (Oral)  Resp 16  SpO2  97% Physical Exam  Constitutional: He is oriented to person, place, and time. He appears well-developed and well-nourished. No distress.  +obese  HENT:  Head: Normocephalic and atraumatic.  Eyes: Conjunctivae are normal. No scleral icterus.  Cardiovascular: Normal rate.   Pulmonary/Chest: Effort normal.  Musculoskeletal:       Left ankle: Achilles tendon normal.       Feet:  Outlined area is with pain, erythema and moderate STS without induration or fluctuance. Skin is intact CSM exam of left foot and ankle intact  Neurological: He is alert and oriented to person, place, and time.  Skin: Skin is warm and dry. There is erythema.  Psychiatric: He has a normal mood and affect. His behavior is normal.  Nursing note and vitals reviewed.   ED Course  Procedures (including critical care time) Labs Review Labs Reviewed - No data to display  Imaging Review No results found.   MDM   1. Acute gout of left ankle, unspecified cause    Patient advised to improve hydration and use medications (colchicine and indocin) as prescribed with follow up with PCP if no improvement.     Ria ClockJennifer Lee H Welden Hausmann, GeorgiaPA 07/13/14 1026

## 2014-07-13 NOTE — ED Notes (Signed)
Reports for the last couple of years has had episodes of left heel pain every 6 months.  Patient stays off of it and it resolves.  Reports this episode is worse.  Reports left ankle and heel pain and left great toe tingles

## 2014-07-20 ENCOUNTER — Ambulatory Visit (HOSPITAL_COMMUNITY): Payer: Medicare Other | Attending: Cardiovascular Disease | Admitting: Radiology

## 2014-07-20 DIAGNOSIS — E785 Hyperlipidemia, unspecified: Secondary | ICD-10-CM | POA: Diagnosis not present

## 2014-07-20 DIAGNOSIS — F1021 Alcohol dependence, in remission: Secondary | ICD-10-CM | POA: Diagnosis not present

## 2014-07-20 DIAGNOSIS — I469 Cardiac arrest, cause unspecified: Secondary | ICD-10-CM | POA: Insufficient documentation

## 2014-07-20 DIAGNOSIS — I10 Essential (primary) hypertension: Secondary | ICD-10-CM | POA: Diagnosis not present

## 2014-07-20 DIAGNOSIS — G4733 Obstructive sleep apnea (adult) (pediatric): Secondary | ICD-10-CM | POA: Diagnosis not present

## 2014-07-20 DIAGNOSIS — Z87891 Personal history of nicotine dependence: Secondary | ICD-10-CM | POA: Diagnosis not present

## 2014-07-20 DIAGNOSIS — I77819 Aortic ectasia, unspecified site: Secondary | ICD-10-CM

## 2014-07-20 DIAGNOSIS — I7789 Other specified disorders of arteries and arterioles: Secondary | ICD-10-CM

## 2014-07-20 DIAGNOSIS — Z6836 Body mass index (BMI) 36.0-36.9, adult: Secondary | ICD-10-CM | POA: Diagnosis not present

## 2014-07-20 DIAGNOSIS — E669 Obesity, unspecified: Secondary | ICD-10-CM | POA: Insufficient documentation

## 2014-07-20 NOTE — Progress Notes (Signed)
Echocardiogram performed.  

## 2015-03-14 DIAGNOSIS — K429 Umbilical hernia without obstruction or gangrene: Secondary | ICD-10-CM | POA: Diagnosis not present

## 2015-03-25 ENCOUNTER — Telehealth: Payer: Self-pay | Admitting: *Deleted

## 2015-03-25 NOTE — Telephone Encounter (Signed)
Faxed cardiac clearance to [facility] for Laparascopic umbilical hernia repair with mesh, under general anesthesia. Dr. Graciela Husbands wrote "No cardiac issues for surgery unless has developed symptoms".

## 2015-03-29 ENCOUNTER — Other Ambulatory Visit: Payer: Self-pay | Admitting: General Surgery

## 2015-05-14 NOTE — Pre-Procedure Instructions (Signed)
Cloy Cozzens Gettinger  05/14/2015       Your procedure is scheduled on November 1  Report to [facility] Admitting at 5:30 A.M.  Call this number if you have problems the morning of surgery:  [phone]   Remember:  Do not eat food or drink liquids after midnight.  Take these medicines the morning of surgery with A SIP OF WATER Albuterol (if needed), Xanax (if needed), Carvedilol, Omeprazole, Zoloft   STOP Naproxen, Fish Oil, Aspirin Tuesday October 25   STOP/ Do not take Aspirin, Aleve, Naproxen, Advil, Ibuprofen, Motrin, Vitamins, Herbs, or Supplements starting October 25   Do not wear jewelry, make-up or nail polish.  Do not wear lotions, powders, or perfumes.  You may wear deodorant.  Do not shave 48 hours prior to surgery.  Men may shave face and neck.  Do not bring valuables to the hospital.  [facility] is not responsible for any belongings or valuables.  Contacts, dentures or bridgework may not be worn into surgery.  Leave your suitcase in the car.  After surgery it may be brought to your room.  For patients admitted to the hospital, discharge time will be determined by your treatment team.  Patients discharged the day of surgery will not be allowed to drive home.   [facility] - Preparing for Surgery  Before surgery, you can play an important role.  Because skin is not sterile, your skin needs to be as free of germs as possible.  You can reduce the number of germs on you skin by washing with CHG (chlorahexidine gluconate) soap before surgery.  CHG is an antiseptic cleaner which kills germs and bonds with the skin to continue killing germs even after washing.  Please DO NOT use if you have an allergy to CHG or antibacterial soaps.  If your skin becomes reddened/irritated stop using the CHG and inform your nurse when you arrive at Short Stay.  Do not shave (including legs and underarms) for at least 48 hours prior to the first CHG shower.  You may shave your  face.  Please follow these instructions carefully:   1.  Shower with CHG Soap the night before surgery and the morning of Surgery.  2.  If you choose to wash your hair, wash your hair first as usual with your normal shampoo.  3.  After you shampoo, rinse your hair and body thoroughly to remove the shampoo.  4.  Use CHG as you would any other liquid soap.  You can apply CHG directly to the skin and wash gently with scrungie or a clean washcloth.  5.  Apply the CHG Soap to your body ONLY FROM THE NECK DOWN.  Do not use on open wounds or open sores.  Avoid contact with your eyes, ears, mouth and genitals (private parts).  Wash genitals (private parts) with your normal soap.  6.  Wash thoroughly, paying special attention to the area where your surgery will be performed.  7.  Thoroughly rinse your body with warm water from the neck down.  8.  DO NOT shower/wash with your normal soap after using and rinsing off the CHG Soap.  9.  Pat yourself dry with a clean towel.            10.  Wear clean pajamas.            11.  Place clean sheets on your bed the night of your first shower and do not sleep  with pets.  Day of Surgery  Do not apply any lotions the morning of surgery.  Please wear clean clothes to the hospital/surgery center.   Please read over the following fact sheets that you were given. Pain Booklet, Coughing and Deep Breathing and Surgical Site Infection Prevention

## 2015-05-16 ENCOUNTER — Encounter (HOSPITAL_COMMUNITY): Payer: Self-pay

## 2015-05-16 ENCOUNTER — Encounter (HOSPITAL_COMMUNITY)
Admission: RE | Admit: 2015-05-16 | Discharge: 2015-05-16 | Disposition: A | Discharge status: Home / Self Care | Payer: Medicare Other | Source: Ambulatory Visit | Attending: General Surgery | Admitting: General Surgery

## 2015-05-16 DIAGNOSIS — Z01812 Encounter for preprocedural laboratory examination: Secondary | ICD-10-CM | POA: Diagnosis not present

## 2015-05-16 DIAGNOSIS — K429 Umbilical hernia without obstruction or gangrene: Secondary | ICD-10-CM | POA: Insufficient documentation

## 2015-05-16 LAB — CBC WITH DIFFERENTIAL/PLATELET
BASOS PCT: 1 %
Basophils Absolute: 0.1 10*3/uL (ref 0.0–0.1)
EOS ABS: 0.2 10*3/uL (ref 0.0–0.7)
EOS PCT: 2 %
HCT: 50.9 % (ref 39.0–52.0)
HEMOGLOBIN: 17.4 g/dL — AB (ref 13.0–17.0)
LYMPHS PCT: 26 %
Lymphs Abs: 2.2 10*3/uL (ref 0.7–4.0)
MCH: 30.2 pg (ref 26.0–34.0)
MCHC: 34.2 g/dL (ref 30.0–36.0)
MCV: 88.4 fL (ref 78.0–100.0)
MONO ABS: 0.6 10*3/uL (ref 0.1–1.0)
Monocytes Relative: 7 %
Neutro Abs: 5.3 10*3/uL (ref 1.7–7.7)
Neutrophils Relative %: 64 %
PLATELETS: 187 10*3/uL (ref 150–400)
RBC: 5.76 MIL/uL (ref 4.22–5.81)
RDW: 13.3 % (ref 11.5–15.5)
WBC: 8.3 10*3/uL (ref 4.0–10.5)

## 2015-05-16 LAB — BASIC METABOLIC PANEL
Anion gap: 8 (ref 5–15)
BUN: 13 mg/dL (ref 6–20)
CALCIUM: 9.6 mg/dL (ref 8.9–10.3)
CO2: 27 mmol/L (ref 22–32)
CREATININE: 0.75 mg/dL (ref 0.61–1.24)
Chloride: 103 mmol/L (ref 101–111)
GFR calc Af Amer: >60 mL/min (ref >60)
GLUCOSE: 85 mg/dL (ref 65–99)
POTASSIUM: 5 mmol/L (ref 3.5–5.1)
SODIUM: 138 mmol/L (ref 135–145)

## 2015-05-23 MED ORDER — HEPARIN SODIUM (PORCINE) 5000 UNIT/ML IJ SOLN
5000.0000 [IU] | Freq: Once | INTRAMUSCULAR | Status: AC
  Administered 2015-05-24: 5000 [IU] via SUBCUTANEOUS
  Filled 2015-05-23: qty 1

## 2015-05-23 MED ORDER — CEFAZOLIN SODIUM-DEXTROSE 2-3 GM-% IV SOLR
2.0000 g | INTRAVENOUS | Status: AC
  Administered 2015-05-24: 2 g via INTRAVENOUS
  Filled 2015-05-23: qty 50

## 2015-05-24 ENCOUNTER — Ambulatory Visit (HOSPITAL_COMMUNITY): Payer: Medicare Other | Admitting: Certified Registered Nurse Anesthetist

## 2015-05-24 ENCOUNTER — Encounter (HOSPITAL_COMMUNITY): Payer: Self-pay | Admitting: *Deleted

## 2015-05-24 ENCOUNTER — Encounter (HOSPITAL_COMMUNITY)
Admission: RE | Disposition: A | Discharge status: Home / Self Care | Payer: Self-pay | Source: Ambulatory Visit | Attending: General Surgery

## 2015-05-24 ENCOUNTER — Observation Stay (HOSPITAL_COMMUNITY)
Admission: RE | Admit: 2015-05-24 | Discharge: 2015-05-26 | Disposition: A | Discharge status: Home / Self Care | Payer: Medicare Other | Source: Ambulatory Visit | Attending: General Surgery | Admitting: General Surgery

## 2015-05-24 DIAGNOSIS — Z87891 Personal history of nicotine dependence: Secondary | ICD-10-CM | POA: Diagnosis not present

## 2015-05-24 DIAGNOSIS — Z79899 Other long term (current) drug therapy: Secondary | ICD-10-CM | POA: Diagnosis not present

## 2015-05-24 DIAGNOSIS — R339 Retention of urine, unspecified: Secondary | ICD-10-CM | POA: Diagnosis not present

## 2015-05-24 DIAGNOSIS — J449 Chronic obstructive pulmonary disease, unspecified: Secondary | ICD-10-CM | POA: Insufficient documentation

## 2015-05-24 DIAGNOSIS — Z8674 Personal history of sudden cardiac arrest: Secondary | ICD-10-CM | POA: Diagnosis not present

## 2015-05-24 DIAGNOSIS — I10 Essential (primary) hypertension: Secondary | ICD-10-CM | POA: Insufficient documentation

## 2015-05-24 DIAGNOSIS — K429 Umbilical hernia without obstruction or gangrene: Secondary | ICD-10-CM | POA: Diagnosis present

## 2015-05-24 HISTORY — DX: Anxiety disorder, unspecified: F41.9

## 2015-05-24 HISTORY — DX: Obstructive sleep apnea (adult) (pediatric): G47.33

## 2015-05-24 HISTORY — DX: Depression, unspecified: F32.A

## 2015-05-24 HISTORY — DX: Dependence on other enabling machines and devices: Z99.89

## 2015-05-24 HISTORY — DX: Heart failure, unspecified: I50.9

## 2015-05-24 HISTORY — PX: LAPAROSCOPIC INCISIONAL / UMBILICAL / VENTRAL HERNIA REPAIR: SUR789

## 2015-05-24 HISTORY — DX: Major depressive disorder, single episode, unspecified: F32.9

## 2015-05-24 HISTORY — PX: INSERTION OF MESH: SHX5868

## 2015-05-24 HISTORY — PX: UMBILICAL HERNIA REPAIR: SHX196

## 2015-05-24 SURGERY — REPAIR, HERNIA, UMBILICAL, LAPAROSCOPIC
Anesthesia: General | Site: Abdomen

## 2015-05-24 MED ORDER — SUCCINYLCHOLINE CHLORIDE 20 MG/ML IJ SOLN
INTRAMUSCULAR | Status: AC
  Filled 2015-05-24: qty 1

## 2015-05-24 MED ORDER — SODIUM CHLORIDE 0.9 % IJ SOLN
INTRAMUSCULAR | Status: AC
Start: 2015-05-24 — End: 2015-05-24
  Filled 2015-05-24: qty 10

## 2015-05-24 MED ORDER — ROCURONIUM BROMIDE 50 MG/5ML IV SOLN
INTRAVENOUS | Status: AC
Start: 2015-05-24 — End: 2015-05-24
  Filled 2015-05-24: qty 1

## 2015-05-24 MED ORDER — LIDOCAINE HCL (CARDIAC) 20 MG/ML IV SOLN
INTRAVENOUS | Status: DC | PRN
  Administered 2015-05-24: 20 mg via INTRAVENOUS

## 2015-05-24 MED ORDER — BUPIVACAINE-EPINEPHRINE 0.25% -1:200000 IJ SOLN
INTRAMUSCULAR | Status: DC | PRN
  Administered 2015-05-24: 10 mL

## 2015-05-24 MED ORDER — GLYCOPYRROLATE 0.2 MG/ML IJ SOLN
INTRAMUSCULAR | Status: AC
  Filled 2015-05-24: qty 1

## 2015-05-24 MED ORDER — ALPRAZOLAM 0.25 MG PO TABS
0.2500 mg | ORAL_TABLET | Freq: Three times a day (TID) | ORAL | Status: DC | PRN
  Administered 2015-05-24: 0.25 mg via ORAL
  Filled 2015-05-24: qty 1

## 2015-05-24 MED ORDER — EPHEDRINE SULFATE 50 MG/ML IJ SOLN
INTRAMUSCULAR | Status: AC
  Filled 2015-05-24: qty 1

## 2015-05-24 MED ORDER — CARVEDILOL 3.125 MG PO TABS
3.1250 mg | ORAL_TABLET | Freq: Every day | ORAL | Status: DC
  Administered 2015-05-25 – 2015-05-26 (×2): 3.125 mg via ORAL
  Filled 2015-05-24 (×2): qty 1

## 2015-05-24 MED ORDER — HYDROMORPHONE HCL 1 MG/ML IJ SOLN
INTRAMUSCULAR | Status: AC
  Filled 2015-05-24: qty 1

## 2015-05-24 MED ORDER — MIDAZOLAM HCL 5 MG/5ML IJ SOLN
INTRAMUSCULAR | Status: DC | PRN
  Administered 2015-05-24: 2 mg via INTRAVENOUS

## 2015-05-24 MED ORDER — LIDOCAINE HCL (CARDIAC) 20 MG/ML IV SOLN
INTRAVENOUS | Status: AC
  Filled 2015-05-24: qty 5

## 2015-05-24 MED ORDER — DEXTROSE 5 % IV SOLN
10.0000 mg | INTRAVENOUS | Status: DC | PRN
  Administered 2015-05-24: 20 ug/min via INTRAVENOUS

## 2015-05-24 MED ORDER — OXYCODONE HCL 5 MG PO TABS
5.0000 mg | ORAL_TABLET | ORAL | Status: DC | PRN
  Administered 2015-05-24 – 2015-05-26 (×7): 10 mg via ORAL
  Filled 2015-05-24 (×7): qty 2

## 2015-05-24 MED ORDER — ENOXAPARIN SODIUM 40 MG/0.4ML ~~LOC~~ SOLN
40.0000 mg | SUBCUTANEOUS | Status: DC
  Administered 2015-05-25 – 2015-05-26 (×2): 40 mg via SUBCUTANEOUS
  Filled 2015-05-24 (×2): qty 0.4

## 2015-05-24 MED ORDER — GLYCOPYRROLATE 0.2 MG/ML IJ SOLN
INTRAMUSCULAR | Status: AC
  Filled 2015-05-24: qty 4

## 2015-05-24 MED ORDER — MIDAZOLAM HCL 2 MG/2ML IJ SOLN
INTRAMUSCULAR | Status: AC
  Filled 2015-05-24: qty 4

## 2015-05-24 MED ORDER — ASPIRIN EC 81 MG PO TBEC
81.0000 mg | DELAYED_RELEASE_TABLET | Freq: Every day | ORAL | Status: DC
  Administered 2015-05-25 – 2015-05-26 (×2): 81 mg via ORAL
  Filled 2015-05-24 (×2): qty 1

## 2015-05-24 MED ORDER — PROMETHAZINE HCL 25 MG/ML IJ SOLN
6.2500 mg | INTRAMUSCULAR | Status: DC | PRN
Start: 2015-05-24 — End: 2015-05-24

## 2015-05-24 MED ORDER — SERTRALINE HCL 50 MG PO TABS
50.0000 mg | ORAL_TABLET | Freq: Every morning | ORAL | Status: DC
  Administered 2015-05-25 – 2015-05-26 (×2): 50 mg via ORAL
  Filled 2015-05-24 (×2): qty 1

## 2015-05-24 MED ORDER — FENTANYL CITRATE (PF) 100 MCG/2ML IJ SOLN
INTRAMUSCULAR | Status: DC | PRN
  Administered 2015-05-24 (×2): 50 ug via INTRAVENOUS
  Administered 2015-05-24 (×2): 100 ug via INTRAVENOUS
  Administered 2015-05-24: 50 ug via INTRAVENOUS

## 2015-05-24 MED ORDER — SODIUM CHLORIDE 0.9 % IV SOLN
INTRAVENOUS | Status: DC
  Administered 2015-05-24: 75 mL/h via INTRAVENOUS
  Administered 2015-05-25: 01:00:00 via INTRAVENOUS

## 2015-05-24 MED ORDER — FENTANYL CITRATE (PF) 250 MCG/5ML IJ SOLN
INTRAMUSCULAR | Status: AC
Start: 2015-05-24 — End: 2015-05-24
  Filled 2015-05-24: qty 5

## 2015-05-24 MED ORDER — IRBESARTAN 75 MG PO TABS
37.5000 mg | ORAL_TABLET | Freq: Every day | ORAL | Status: DC
  Administered 2015-05-25 – 2015-05-26 (×2): 37.5 mg via ORAL
  Filled 2015-05-24 (×2): qty 1

## 2015-05-24 MED ORDER — PROPOFOL 10 MG/ML IV BOLUS
INTRAVENOUS | Status: DC | PRN
  Administered 2015-05-24: 120 mg via INTRAVENOUS

## 2015-05-24 MED ORDER — FENTANYL CITRATE (PF) 250 MCG/5ML IJ SOLN
INTRAMUSCULAR | Status: AC
  Filled 2015-05-24: qty 5

## 2015-05-24 MED ORDER — LACTATED RINGERS IV SOLN
INTRAVENOUS | Status: DC | PRN
  Administered 2015-05-24: 07:00:00 via INTRAVENOUS

## 2015-05-24 MED ORDER — ONDANSETRON HCL 4 MG/2ML IJ SOLN
INTRAMUSCULAR | Status: AC
  Filled 2015-05-24: qty 2

## 2015-05-24 MED ORDER — PROPOFOL 10 MG/ML IV BOLUS
INTRAVENOUS | Status: AC
  Filled 2015-05-24: qty 20

## 2015-05-24 MED ORDER — ONDANSETRON HCL 4 MG/2ML IJ SOLN
INTRAMUSCULAR | Status: DC | PRN
Start: 2015-05-24 — End: 2015-05-24
  Administered 2015-05-24: 4 mg via INTRAVENOUS

## 2015-05-24 MED ORDER — HYDROMORPHONE HCL 1 MG/ML IJ SOLN
1.0000 mg | INTRAMUSCULAR | Status: DC | PRN
Start: 2015-05-24 — End: 2015-05-26
  Administered 2015-05-24 – 2015-05-25 (×4): 1 mg via INTRAVENOUS
  Filled 2015-05-24 (×4): qty 1

## 2015-05-24 MED ORDER — METHOCARBAMOL 1000 MG/10ML IJ SOLN
500.0000 mg | Freq: Three times a day (TID) | INTRAVENOUS | Status: DC | PRN
  Administered 2015-05-24 – 2015-05-25 (×3): 500 mg via INTRAVENOUS
  Filled 2015-05-24 (×6): qty 5

## 2015-05-24 MED ORDER — 0.9 % SODIUM CHLORIDE (POUR BTL) OPTIME
TOPICAL | Status: DC | PRN
  Administered 2015-05-24: 1000 mL

## 2015-05-24 MED ORDER — ALBUTEROL SULFATE (2.5 MG/3ML) 0.083% IN NEBU
3.0000 mL | INHALATION_SOLUTION | Freq: Four times a day (QID) | RESPIRATORY_TRACT | Status: DC | PRN
  Administered 2015-05-25: 3 mL via RESPIRATORY_TRACT
  Filled 2015-05-24: qty 3

## 2015-05-24 MED ORDER — BUPIVACAINE-EPINEPHRINE (PF) 0.25% -1:200000 IJ SOLN
INTRAMUSCULAR | Status: AC
  Filled 2015-05-24: qty 30

## 2015-05-24 MED ORDER — PHENYLEPHRINE 40 MCG/ML (10ML) SYRINGE FOR IV PUSH (FOR BLOOD PRESSURE SUPPORT)
PREFILLED_SYRINGE | INTRAVENOUS | Status: AC
  Filled 2015-05-24: qty 10

## 2015-05-24 MED ORDER — MIDAZOLAM HCL 2 MG/2ML IJ SOLN: 0.5000 mg | Freq: Once | INTRAMUSCULAR | Status: DC | PRN

## 2015-05-24 MED ORDER — SIMETHICONE 80 MG PO CHEW: 40.0000 mg | CHEWABLE_TABLET | Freq: Four times a day (QID) | ORAL | Status: DC | PRN

## 2015-05-24 MED ORDER — HYDROMORPHONE HCL 1 MG/ML IJ SOLN
0.2500 mg | INTRAMUSCULAR | Status: DC | PRN
  Administered 2015-05-24 (×4): 0.5 mg via INTRAVENOUS

## 2015-05-24 MED ORDER — ONDANSETRON HCL 4 MG/2ML IJ SOLN
4.0000 mg | Freq: Four times a day (QID) | INTRAMUSCULAR | Status: DC | PRN
  Administered 2015-05-24: 4 mg via INTRAVENOUS
  Filled 2015-05-24: qty 2

## 2015-05-24 MED ORDER — PANTOPRAZOLE SODIUM 40 MG PO TBEC
40.0000 mg | DELAYED_RELEASE_TABLET | Freq: Every day | ORAL | Status: DC
  Administered 2015-05-25 – 2015-05-26 (×2): 40 mg via ORAL
  Filled 2015-05-24 (×2): qty 1

## 2015-05-24 MED ORDER — ONDANSETRON 4 MG PO TBDP: 4.0000 mg | ORAL_TABLET | Freq: Four times a day (QID) | ORAL | Status: DC | PRN

## 2015-05-24 MED ORDER — GLYCOPYRROLATE 0.2 MG/ML IJ SOLN
INTRAMUSCULAR | Status: DC | PRN
  Administered 2015-05-24: .8 mg via INTRAVENOUS
  Administered 2015-05-24: .2 mg via INTRAVENOUS

## 2015-05-24 MED ORDER — NEOSTIGMINE METHYLSULFATE 10 MG/10ML IV SOLN
INTRAVENOUS | Status: DC | PRN
  Administered 2015-05-24: 5 mg via INTRAVENOUS

## 2015-05-24 MED ORDER — ROCURONIUM BROMIDE 100 MG/10ML IV SOLN
INTRAVENOUS | Status: DC | PRN
  Administered 2015-05-24: 50 mg via INTRAVENOUS
  Administered 2015-05-24: 20 mg via INTRAVENOUS

## 2015-05-24 MED ORDER — MEPERIDINE HCL 25 MG/ML IJ SOLN: 6.2500 mg | INTRAMUSCULAR | Status: DC | PRN

## 2015-05-24 SURGICAL SUPPLY — 72 items
APL SKNCLS STERI-STRIP NONHPOA (GAUZE/BANDAGES/DRESSINGS) ×2
APPLIER CLIP 5 13 M/L LIGAMAX5 (MISCELLANEOUS)
APPLIER CLIP ROT 10 11.4 M/L (STAPLE)
APR CLP MED LRG 11.4X10 (STAPLE)
APR CLP MED LRG 5 ANG JAW (MISCELLANEOUS)
BENZOIN TINCTURE PRP APPL 2/3 (GAUZE/BANDAGES/DRESSINGS) ×4 IMPLANT
CANISTER SUCTION 2500CC (MISCELLANEOUS) IMPLANT
CHLORAPREP W/TINT 26ML (MISCELLANEOUS) ×4 IMPLANT
CLIP APPLIE 5 13 M/L LIGAMAX5 (MISCELLANEOUS) IMPLANT
CLIP APPLIE ROT 10 11.4 M/L (STAPLE) IMPLANT
CLOSURE WOUND 1/2 X4 (GAUZE/BANDAGES/DRESSINGS) ×1
COVER SURGICAL LIGHT HANDLE (MISCELLANEOUS) ×4 IMPLANT
DECANTER SPIKE VIAL GLASS SM (MISCELLANEOUS) ×4 IMPLANT
DEVICE RELIATACK FIXATION (MISCELLANEOUS) ×4 IMPLANT
DEVICE SECURE STRAP 25 ABSORB (INSTRUMENTS) ×1 IMPLANT
DEVICE TROCAR PUNCTURE CLOSURE (ENDOMECHANICALS) ×4 IMPLANT
DRAPE INCISE IOBAN 66X45 STRL (DRAPES) ×4 IMPLANT
DRAPE LAPAROSCOPIC ABDOMINAL (DRAPES) ×4 IMPLANT
DRSG TEGADERM 4X4.75 (GAUZE/BANDAGES/DRESSINGS) ×4 IMPLANT
ELECT CAUTERY BLADE 6.4 (BLADE) ×4 IMPLANT
ELECT REM PT RETURN 9FT ADLT (ELECTROSURGICAL) ×4
ELECTRODE REM PT RTRN 9FT ADLT (ELECTROSURGICAL) ×2 IMPLANT
GAUZE SPONGE 2X2 8PLY STRL LF (GAUZE/BANDAGES/DRESSINGS) ×2 IMPLANT
GLOVE BIO SURGEON STRL SZ7 (GLOVE) ×4 IMPLANT
GLOVE BIO SURGEON STRL SZ7.5 (GLOVE) ×3 IMPLANT
GLOVE BIOGEL PI IND STRL 7.5 (GLOVE) ×2 IMPLANT
GLOVE BIOGEL PI IND STRL 8.5 (GLOVE) ×1 IMPLANT
GLOVE BIOGEL PI INDICATOR 7.5 (GLOVE) ×4
GLOVE BIOGEL PI INDICATOR 8.5 (GLOVE) ×2
GLOVE SURG SS PI 8.5 STRL IVOR (GLOVE) ×2
GLOVE SURG SS PI 8.5 STRL STRW (GLOVE) IMPLANT
GOWN STRL REUS W/ TWL LRG LVL3 (GOWN DISPOSABLE) ×6 IMPLANT
GOWN STRL REUS W/TWL LRG LVL3 (GOWN DISPOSABLE) ×12
KIT BASIN OR (CUSTOM PROCEDURE TRAY) ×4 IMPLANT
KIT ROOM TURNOVER OR (KITS) ×4 IMPLANT
LIQUID BAND (GAUZE/BANDAGES/DRESSINGS) ×4 IMPLANT
MARKER SKIN DUAL TIP RULER LAB (MISCELLANEOUS) ×4 IMPLANT
MESH VENTRALIGHT ST 6IN CRC (Mesh General) ×2 IMPLANT
NDL SPNL 22GX3.5 QUINCKE BK (NEEDLE) ×2 IMPLANT
NEEDLE SPNL 22GX3.5 QUINCKE BK (NEEDLE) ×4 IMPLANT
NS IRRIG 1000ML POUR BTL (IV SOLUTION) ×4 IMPLANT
PAD ARMBOARD 7.5X6 YLW CONV (MISCELLANEOUS) ×8 IMPLANT
PENCIL BUTTON HOLSTER BLD 10FT (ELECTRODE) ×3 IMPLANT
RELOAD ENDO RELIATCK 10 HERNIA (MISCELLANEOUS) IMPLANT
RELOAD ENDO RELIATCK 5 HERNIA (MISCELLANEOUS) IMPLANT
RELOAD RELIATACK 10 (MISCELLANEOUS) IMPLANT
RELOAD RELIATACK 5 (MISCELLANEOUS) ×12 IMPLANT
SCALPEL HARMONIC ACE (MISCELLANEOUS) IMPLANT
SCISSORS LAP 5X35 DISP (ENDOMECHANICALS) IMPLANT
SET IRRIG TUBING LAPAROSCOPIC (IRRIGATION / IRRIGATOR) IMPLANT
SLEEVE ENDOPATH XCEL 5M (ENDOMECHANICALS) IMPLANT
SPONGE GAUZE 2X2 STER 10/PKG (GAUZE/BANDAGES/DRESSINGS) ×2
SPONGE GAUZE 4X4 12PLY STER LF (GAUZE/BANDAGES/DRESSINGS) ×4 IMPLANT
STRIP CLOSURE SKIN 1/2X4 (GAUZE/BANDAGES/DRESSINGS) ×3 IMPLANT
SUT MNCRL AB 4-0 PS2 18 (SUTURE) ×8 IMPLANT
SUT PROLENE 0 CT 1 CR/8 (SUTURE) ×4 IMPLANT
SUT VIC AB 2-0 SH 27 (SUTURE) ×4
SUT VIC AB 2-0 SH 27XBRD (SUTURE) IMPLANT
SUT VIC AB 3-0 SH 27 (SUTURE) ×4
SUT VIC AB 3-0 SH 27X BRD (SUTURE) IMPLANT
TOWEL OR 17X24 6PK STRL BLUE (TOWEL DISPOSABLE) ×4 IMPLANT
TOWEL OR 17X26 10 PK STRL BLUE (TOWEL DISPOSABLE) ×4 IMPLANT
TRAY FOLEY CATH 14FRSI W/METER (CATHETERS) ×4 IMPLANT
TRAY LAPAROSCOPIC MC (CUSTOM PROCEDURE TRAY) ×4 IMPLANT
TROCAR XCEL BLUNT TIP 100MML (ENDOMECHANICALS) ×4 IMPLANT
TROCAR XCEL NON-BLD 11X100MML (ENDOMECHANICALS) ×4 IMPLANT
TROCAR XCEL NON-BLD 5MMX100MML (ENDOMECHANICALS) ×4 IMPLANT
TUBE CONNECTING 12'X1/4 (SUCTIONS) ×1
TUBE CONNECTING 12X1/4 (SUCTIONS) ×1 IMPLANT
TUBING INSUFF HIGH FLOW RTP (TUBING) ×4 IMPLANT
WATER STERILE IRR 1000ML POUR (IV SOLUTION) IMPLANT
YANKAUER SUCT BULB TIP NO VENT (SUCTIONS) ×2 IMPLANT

## 2015-05-24 NOTE — H&P (Signed)
   [age] yom with a couple year history of an umbilical bulge that is getting larger. he has some discomfort. can reduce when lying down but always comes back out. skin getting thin. no issues with bms, n/v. he had cardiac arrest a couple years ago and is seen by cards. has osa on cpap.  Other Problems  Alcohol Abuse Back Pain Congestive Heart Failure Depression Gastroesophageal Reflux Disease High blood pressure Inguinal Hernia Umbilical Hernia Repair  Past Surgical History Colon Polyp Removal - Colonoscopy Spinal Surgery Midback  Diagnostic Studies History  Colonoscopy 1-5 years ago  Allergies  Lisinopril *ANTIHYPERTENSIVES* Amoxicillin *PENICILLINS*  Medication History  ALPRAZolam (0.25MG  Tablet, Oral) Active. Carvedilol (12.5MG  Tablet, Oral) Active. Omeprazole (20MG  Capsule DR, Oral) Active. Flovent Diskus (250MCG/BLIST Aero Pow Br Act, Inhalation) Active. Sertraline HCl (100MG  Tablet, Oral) Active. Valsartan (80MG  Tablet, Oral) Active. Benztropine Mesylate (2MG  Tablet, Oral) Active. Symbicort (160-4.5MCG/ACT Aerosol, Inhalation) Active. Abilify (10MG  Tablet, Oral) Active. Fish Oil + D3 (1000-1000MG -UNIT Capsule, Oral) Active. Medications Reconciled  Social History  Alcohol use Remotely quit alcohol use. Caffeine use Coffee, Tea. No drug use Tobacco use Former smoker.  Family History  Family history unknown First Degree Relatives  Review of Systems  General Not Present- Appetite Loss, Chills, Fatigue, Fever, Night Sweats, Weight Gain and Weight Loss. Skin Not Present- Change in Wart/Mole, Dryness, Hives, Jaundice, New Lesions, Non-Healing Wounds, Rash and Ulcer. HEENT Not Present- Earache, Hearing Loss, Hoarseness, Nose Bleed, Oral Ulcers, Ringing in the Ears, Seasonal Allergies, Sinus Pain, Sore Throat, Visual Disturbances, Wears glasses/contact lenses and Yellow Eyes. Respiratory Not Present- Bloody sputum, Chronic Cough,  Difficulty Breathing, Snoring and Wheezing. Breast Not Present- Breast Mass, Breast Pain, Nipple Discharge and Skin Changes. Gastrointestinal Not Present- Abdominal Pain, Bloating, Bloody Stool, Change in Bowel Habits, Chronic diarrhea, Constipation, Difficulty Swallowing, Excessive gas, Gets full quickly at meals, Hemorrhoids, Indigestion, Nausea, Rectal Pain and Vomiting. Male Genitourinary Present- Change in Urinary Stream and Impotence. Not Present- Blood in Urine, Frequency, Nocturia, Painful Urination, Urgency and Urine Leakage. Musculoskeletal Present- Back Pain. Not Present- Joint Pain, Joint Stiffness, Muscle Pain, Muscle Weakness and Swelling of Extremities. Neurological Present- Decreased Memory. Not Present- Fainting, Headaches, Numbness, Seizures, Tingling, Tremor, Trouble walking and Weakness.  Vitals  Weight: 249.2 lb Height: 69in Body Surface Area: 2.35 m Body Mass Index: 36.8 kg/m  Temp.: 97.65F(Temporal)  Pulse: 61 (Regular)  BP: 130/82 (Sitting, Left Arm, Standard)  Physical Exam  General Mental Status-Alert. Orientation-Oriented X3. Chest and Lung Exam Chest and lung exam reveals -on auscultation, normal breath sounds, no adventitious sounds and normal vocal resonance. Cardiovascular Cardiovascular examination reveals -normal heart sounds, regular rate and rhythm with no murmurs. Abdomen Note: reducible umbilical hernia, soft, nontender, thinned out skin overlying it, this is about 3 cm defect   Assessment & Plan  UMBILICAL HERNIA (553.1  K42.9) Story: laparoscopic umblical hernia repair with mesh, excision of skin  I think reasonable to repair given symptoms and enlarging. will check with cardiology first for any recommendations. we discussed a lap repair with mesh due to size (would like to place larger mesh than with open repair given size and his habitus) but he would also like skin excised and I think reasonable due to thin nature. will  schedule in october per his request. discussed risks of surgery and restrictions postop

## 2015-05-24 NOTE — Anesthesia Preprocedure Evaluation (Addendum)
Anesthesia Evaluation  Patient identified by MRN, date of birth, ID band Patient awake    Reviewed: Allergy & Precautions, NPO status , Patient's Chart, lab work & pertinent test results  History of Anesthesia Complications Negative for: history of anesthetic complications  Airway Mallampati: II  TM Distance: >3 FB Neck ROM: Full    Dental  (+) Dental Advisory Given, Missing   Pulmonary sleep apnea and Continuous Positive Airway Pressure Ventilation , COPD,  COPD inhaler, Current Smoker,    breath sounds clear to auscultation       Cardiovascular hypertension, Pt. on home beta blockers and Pt. on medications (-) angina+ DVT  + dysrhythmias (h/o arrest with low K+) Ventricular Fibrillation  Rhythm:Regular Rate:Normal  '15 ECHO: EF 55-65%, mild AI, Aortic root enlargement,  45 mm, 2013 46 mm,   Neuro/Psych PSYCHIATRIC DISORDERS Anxiety negative neurological ROS     GI/Hepatic Neg liver ROS, GERD  Medicated and Controlled,  Endo/Other  Morbid obesity  Renal/GU negative Renal ROS     Musculoskeletal  (+) Arthritis , Osteoarthritis,    Abdominal (+) + obese,   Peds  Hematology negative hematology ROS (+)   Anesthesia Other Findings   Reproductive/Obstetrics                          Anesthesia Physical Anesthesia Plan  ASA: III  Anesthesia Plan: General   Post-op Pain Management:    Induction: Intravenous  Airway Management Planned: Oral ETT  Additional Equipment:   Intra-op Plan:   Post-operative Plan: Extubation in OR  Informed Consent: I have reviewed the patients History and Physical, chart, labs and discussed the procedure including the risks, benefits and alternatives for the proposed anesthesia with the patient or authorized representative who has indicated his/her understanding and acceptance.   Dental advisory given  Plan Discussed with: CRNA and Surgeon  Anesthesia Plan  Comments: (Plan routine monitors, GETA)        Anesthesia Quick Evaluation

## 2015-05-24 NOTE — Interval H&P Note (Signed)
History and Physical Interval Note:  05/24/2015 7:17 AM  Robert York  has presented today for surgery, with the diagnosis of UMBILICAL HERNIA  The various methods of treatment have been discussed with the patient and family. After consideration of risks, benefits and other options for treatment, the patient has consented to  Procedure(s): LAPAROSCOPIC UMBILICAL HERNIA REPAIR WITH MESH (N/A) INSERTION OF MESH (N/A) as a surgical intervention .  The patient's history has been reviewed, patient examined, no change in status, stable for surgery.  I have reviewed the patient's chart and labs.  Questions were answered to the patient's satisfaction.     Jandy Brackens

## 2015-05-24 NOTE — Anesthesia Procedure Notes (Signed)
Procedure Name: Intubation Date/Time: 05/24/2015 7:42 AM Performed by: Faustino CongressWHITE, Nicholette Dolson TENA Finnley Lewis Pre-anesthesia Checklist: Patient identified, Emergency Drugs available, Suction available and Patient being monitored Patient Re-evaluated:Patient Re-evaluated prior to inductionOxygen Delivery Method: Circle system utilized Preoxygenation: Pre-oxygenation with 100% oxygen Intubation Type: IV induction Ventilation: Mask ventilation without difficulty Laryngoscope Size: Mac and 4 Grade View: Grade II Tube type: Oral Tube size: 7.5 mm Number of attempts: 1 Airway Equipment and Method: Stylet Placement Confirmation: ETT inserted through vocal cords under direct vision,  positive ETCO2 and breath sounds checked- equal and bilateral Secured at: 23 cm Tube secured with: Tape Dental Injury: Teeth and Oropharynx as per pre-operative assessment

## 2015-05-24 NOTE — Transfer of Care (Signed)
Immediate Anesthesia Transfer of Care Note  Patient: Rande BruntDaniel L Fricker  Procedure(s) Performed: Procedure(s): LAPAROSCOPIC UMBILICAL HERNIA REPAIR WITH MESH (N/A) INSERTION OF MESH (N/A) Excision of umbilical skin  Patient Location: PACU  Anesthesia Type:General  Level of Consciousness: awake, alert  and patient cooperative  Airway & Oxygen Therapy: Patient Spontanous Breathing and Patient connected to nasal cannula oxygen  Post-op Assessment: Report given to RN and Post -op Vital signs reviewed and stable  Post vital signs: Reviewed and stable  Last Vitals:  Filed Vitals:   05/24/15 0625  BP: 145/86  Pulse: 61  Temp: 36.3 C  Resp: 16    Complications: No apparent anesthesia complications

## 2015-05-24 NOTE — Anesthesia Postprocedure Evaluation (Signed)
  Anesthesia Post-op Note  Patient: Robert York  Procedure(s) Performed: Procedure(s): LAPAROSCOPIC UMBILICAL HERNIA REPAIR WITH MESH (N/A) INSERTION OF MESH (N/A) Excision of umbilical skin  Patient Location: PACU  Anesthesia Type:General  Level of Consciousness: awake, alert , oriented and patient cooperative  Airway and Oxygen Therapy: Patient Spontanous Breathing  Post-op Pain: mild  Post-op Assessment: Post-op Vital signs reviewed, Patient's Cardiovascular Status Stable, Respiratory Function Stable, Patent Airway, No signs of Nausea or vomiting and Pain level controlled              Post-op Vital Signs: Reviewed and stable  Last Vitals:  Filed Vitals:   05/24/15 1110  BP: 109/67  Pulse: 57  Temp: 36.6 C  Resp: 14    Complications: No apparent anesthesia complications

## 2015-05-24 NOTE — Discharge Instructions (Signed)
[facility] LAPAROSCOPIC SURGERY: POST OP INSTRUCTIONS  Always review your discharge instruction sheet given to you by the facility where your surgery was performed. IF YOU HAVE DISABILITY OR FAMILY LEAVE FORMS, YOU MUST BRING THEM TO THE OFFICE FOR PROCESSING.   DO NOT GIVE THEM TO YOUR DOCTOR.  1. A prescription for pain medication may be given to you upon discharge.  Take your pain medication as prescribed, if needed.  If narcotic pain medicine is not needed, then you may take acetaminophen (Tylenol), naprosyn (Alleve), or ibuprofen (Advil) as needed. 2. Take your usually prescribed medications unless otherwise directed. 3. If you need a refill on your pain medication, please contact your pharmacy.  They will contact our office to request authorization. Prescriptions will not be filled after 5pm or on week-ends. 4. You should follow a light diet the first few days after arrival home, such as soup and crackers, etc.  Be sure to include lots of fluids daily. 5. Most patients will experience some swelling and bruising in the area of the incisions.  Ice packs will help.  Swelling and bruising can take several days to resolve.  6. It is common to experience some constipation if taking pain medication after surgery.  Increasing fluid intake and taking a stool softener (such as Colace) will usually help or prevent this problem from occurring.  A mild laxative (Milk of Magnesia or Miralax) should be taken according to package instructions if there are no bowel movements after 48 hours. 7. Unless discharge instructions indicate otherwise, you may remove your bandages 48 hours after surgery, and you may shower at that time.  You may have steri-strips (small skin tapes) in place directly over the incision.  These strips should be left on the skin for 7-10 days.  If your surgeon used skin glue on the incision, you may shower in 24 hours.  The glue will flake  off over the next 2-3 weeks.  Any sutures or staples will be removed at the office during your follow-up visit. 8. ACTIVITIES:  You may resume regular (light) daily activities beginning the next day--such as daily self-care, walking, climbing stairs--gradually increasing activities as tolerated.  You may have sexual intercourse when it is comfortable.  Refrain from any heavy lifting or straining until approved by your doctor. a. You may drive when you are no longer taking prescription pain medication, you can comfortably wear a seatbelt, and you can safely maneuver your car and apply brakes. b. RETURN TO WORK:  __________________________________________________________ 9. You should see your doctor in the office for a follow-up appointment approximately 2-3 weeks after your surgery.  Make sure that you call for this appointment within a day or two after you arrive home to insure a convenient appointment time. 10. OTHER INSTRUCTIONS: __________________________________________________________________________________________________________________________ __________________________________________________________________________________________________________________________ WHEN TO CALL YOUR DOCTOR: 1. Fever over 101.0 2. Inability to urinate 3. Continued bleeding from incision. 4. Increased pain, redness, or drainage from the incision. 5. Increasing abdominal pain  The clinic staff is available to answer your questions during regular business hours.  Please don't hesitate to call and ask to speak to one of the nurses for clinical concerns.  If you have a medical emergency, go to the nearest emergency room or call 911.  A surgeon from [facility] is always on call at the hospital. [address], [address], [place], [place]  [address] ? [address] [address], [place], [place]   [address] [phone] ? [phone] ? [phone] Web site: [email or web address]  

## 2015-05-24 NOTE — Progress Notes (Signed)
Report given to philip rn as caregiver 

## 2015-05-24 NOTE — Op Note (Signed)
Preoperative diagnosis: umbilical hernia Postoperative diagnosis: saa Procedure: 1. Laparoscopic umbilical hernia repair with 15 cm ventralight st mesh 2. Excision of 2x4 cm umbilical skin Surgeon: Dr Harden MoMatt Amie Cowens  Anesthesia: general EBL: minimal Complications: none Drains: none Specimens none Sponge and needle count correct dispo to pacu stable  Indications: This is a 64 year old male who has multiple comorbidities but has a symptomatic umbilical hernia. He has been evaluated by his cardiologist preoperatively. We have elected to proceed with a laparoscopic repair of his umbilical hernia with mesh. The excess skin that is present also bothered him and he would like this excised as well.  Procedure: After informed consent was obtained the patient was taken to the operating room. He was given antibiotics and had sequential compression devices in place. He was then placed under general anesthesia without complication. A Foley catheter and orogastric tube were placed. He was prepped and draped in the standard sterile surgical fashion. A surgical timeout was then performed.  I infiltrated Marcaine in the left upper quadrant. I made an incision. I then entered into the abdomen using the 5 mm Optiview trocar. There was no evidence of any entry injury. I then insufflated to 15 mmHg pressure. He had about a 2.5 centimeter umbilical hernia. I then inserted an 11 mm port in the left midabdomen and a 5 mm port in the left lower quadrant without any evidence of injury. Then used a 15 cm ventralight ST mesh. I placed 0 Prolene sutures at all four cardinal positions around the mesh. I then inserted the mesh. I rolled this flat. I measured this out and used the Endo Close device to pull my sutures up and tied this down. I then used the reliatack to tack the ends of the mesh to the abdominal wall. This was in good position upon completion. I then removed my 11mm  trocar and closed this with a 0 Vicryl using the  Endo Close device. The abdomen was desufflated and the remaining trocars removed. I then marked out a 2 x 4 cm area of umbilical skin that I excised. I then closed this with 2-0 Vicryl, 3-0 Vicryl, and a 4-0 Monocryl. The remaining incisions were closed with 4-0 Monocryl. Clips were placed on these. He tolerated this well was extubated and transferred to the recovery room in stable condition.

## 2015-05-25 ENCOUNTER — Encounter (HOSPITAL_COMMUNITY): Payer: Self-pay | Admitting: General Surgery

## 2015-05-25 DIAGNOSIS — K429 Umbilical hernia without obstruction or gangrene: Secondary | ICD-10-CM | POA: Diagnosis not present

## 2015-05-25 LAB — BASIC METABOLIC PANEL
Anion gap: 6 (ref 5–15)
BUN: 13 mg/dL (ref 6–20)
CALCIUM: 9.2 mg/dL (ref 8.9–10.3)
CHLORIDE: 103 mmol/L (ref 101–111)
CO2: 30 mmol/L (ref 22–32)
CREATININE: 0.94 mg/dL (ref 0.61–1.24)
GFR calc Af Amer: >60 mL/min (ref >60)
GFR calc non Af Amer: >60 mL/min (ref >60)
Glucose, Bld: 109 mg/dL — ABNORMAL HIGH (ref 65–99)
Potassium: 4.4 mmol/L (ref 3.5–5.1)
SODIUM: 139 mmol/L (ref 135–145)

## 2015-05-25 NOTE — Progress Notes (Signed)
1 Day Post-Op  Subjective: tol diet, pain controlled, cannot void foley in this am with 750 out  Objective: Vital signs in last 24 hours: Temp:  [97.3 F (36.3 C)-98.1 F (36.7 C)] 98.1 F (36.7 C) (11/02 0522) Pulse Rate:  [53-79] 63 (11/02 0522) Resp:  [8-20] 16 (11/02 0522) BP: (96-143)/(53-87) 137/73 mmHg (11/02 0522) SpO2:  [84 %-96 %] 88 % (11/02 0522) Last BM Date: 05/24/15  Intake/Output from previous day: 11/01 0701 - 11/02 0700 In: 940 [P.O.:240; I.V.:700] Out: 170 [Urine:160; Blood:10] Intake/Output this shift: Total I/O In: -  Out: 750 [Urine:750]  abd approp tender wounds clean  Lab Results:  No results for input(s): WBC, HGB, HCT, PLT in the last 72 hours. BMET No results for input(s): NA, K, CL, CO2, GLUCOSE, BUN, CREATININE, CALCIUM in the last 72 hours. PT/INR No results for input(s): LABPROT, INR in the last 72 hours. ABG No results for input(s): PHART, HCO3 in the last 72 hours.  Invalid input(s): PCO2, PO2  Studies/Results: No results found.  Anti-infectives: Anti-infectives    Start     Dose/Rate Route Frequency Ordered Stop   05/24/15 0700  ceFAZolin (ANCEF) IVPB 2 g/50 mL premix    Comments:  I am aware of itching with amoxicillin   2 g 100 mL/hr over 30 Minutes [facility] 05/23/15 1335 05/24/15 0755      Assessment/Plan:  POD 1 lap umbo hernia repair excision of skin  1. Po pain meds with iv backup 2. pulm toilet, oob 3 regular diet 4. Foley in for retention, will check bmet today 5. Lovenox, scds 6. Hopefully foley out in am and home tomorrow  [facility],Emmanuella Mirante 05/25/2015

## 2015-05-26 DIAGNOSIS — K429 Umbilical hernia without obstruction or gangrene: Secondary | ICD-10-CM | POA: Diagnosis not present

## 2015-05-26 MED ORDER — OXYCODONE HCL 5 MG PO TABS: 5.0000 mg | ORAL_TABLET | ORAL | Status: AC | PRN

## 2015-05-26 NOTE — Discharge Summary (Signed)
Physician Discharge Summary  Patient ID: Robert PoreDaniel L York MRN: [id number] DOB/AGE: [birth year] 66 y.o.  Admit date: 05/24/2015 Discharge date: 05/26/2015  Admission Diagnoses: Umbilical hernia  Discharge Diagnoses:  Active Problems:   Umbilical hernia urinary retention  Discharged Condition: good  Hospital Course: [age] yom who has symptomatic uh with excess skin. I did a laparoscopic umbilical hernia repair with mesh and excision of excess umbilical skin.  He did well except for urinary retention requiring foley placement postop.  This remained in for 24 hours and then was removed. He is now voiding much better and will be discharged home.   Consults: None  Significant Diagnostic Studies: none  Treatments: surgery   Disposition: 01-Home or Self Care     Medication List    TAKE these medications        acetaminophen 500 MG tablet  Commonly known as:  TYLENOL  Take 1,000 mg by mouth every 4 (four) hours as needed.     albuterol 108 (90 BASE) MCG/ACT inhaler  Commonly known as:  PROVENTIL HFA;VENTOLIN HFA  Inhale 2 puffs into the lungs every 6 (six) hours as needed for wheezing.     ALPRAZolam 0.25 MG tablet  Commonly known as:  XANAX  Take 1 tablet (0.25 mg total) by mouth 3 (three) times daily as needed for anxiety.     aspirin EC 81 MG tablet  Take 81 mg by mouth daily.     carvedilol 3.125 MG tablet  Commonly known as:  COREG  Take 3.125 mg by mouth daily.     carvedilol 12.5 MG tablet  Commonly known as:  COREG  Take 0.5 tablets (6.25 mg total) by mouth 2 (two) times daily with a meal. 1/2 tablet  Twice a day     fish oil-omega-3 fatty acids 1000 MG capsule  Take 2 g by mouth daily.     naproxen sodium 220 MG tablet  Commonly known as:  ANAPROX  Take 220 mg by mouth 2 (two) times daily with a meal.     omeprazole 20 MG capsule  Commonly known as:  PRILOSEC  Take 20 mg by mouth daily.     oxyCODONE 5 MG immediate release tablet  Commonly known as:  Oxy  IR/ROXICODONE  Take 1-2 tablets (5-10 mg total) by mouth every 4 (four) hours as needed for moderate pain.     sertraline 100 MG tablet  Commonly known as:  ZOLOFT  Take 50 mg by mouth every morning.     valsartan 80 MG tablet  Commonly known as:  DIOVAN  Take 80 mg by mouth daily.           Follow-up Information    [facility] weeks.   Specialty:  General Surgery   Contact information:   [address] [place] [address] [phone]       Signed: [facility]elia LoronWAKEFIELD,Lanissa Cashen 05/26/2015, 2:03 PM

## 2015-05-26 NOTE — Discharge Planning (Signed)
Patient discharged home in stable condition. Verbalizes understanding of all discharge instructions, including home medications and follow up appointments. 

## 2015-05-26 NOTE — Progress Notes (Signed)
2 Days Post-Op  Subjective: Foley out waiting to void, had bm tol diet most of pain is leg and back that was preexisting  Objective: Vital signs in last 24 hours: Temp:  [97.7 F (36.5 C)-98.4 F (36.9 C)] 98.2 F (36.8 C) (11/03 0443) Pulse Rate:  [62-67] 66 (11/03 0443) Resp:  [16-17] 16 (11/03 0443) BP: (143-162)/(68-99) 152/99 mmHg (11/03 0443) SpO2:  [92 %-100 %] 92 % (11/03 0443) Last BM Date: 05/25/15  Intake/Output from previous day: 11/02 0701 - 11/03 0700 In: 532 [P.O.:240; I.V.:292] Out: 2150 [Urine:2150] Intake/Output this shift:    Resp: clear to auscultation bilaterally Cardio: regular rate and rhythm GI: incisions clean, approp tender bs present  Lab Results:  No results for input(s): WBC, HGB, HCT, PLT in the last 72 hours. BMET  Recent Labs  05/25/15 1026  NA 139  K 4.4  CL 103  CO2 30  GLUCOSE 109*  BUN 13  CREATININE 0.94  CALCIUM 9.2   PT/INR  Anti-infectives: Anti-infectives    Start     Dose/Rate Route Frequency Ordered Stop   05/24/15 0700  ceFAZolin (ANCEF) IVPB 2 g/50 mL premix    Comments:  I am aware of itching with amoxicillin   2 g 100 mL/hr over 30 Minutes [facility] 05/23/15 1335 05/24/15 0755      Assessment/Plan: POD 2 lap umbo hernia repair excision of skin  1. Po pain meds with iv backup 2. pulm toilet, oob 3 regular diet 4.foley removed this am already 5. Lovenox, scds 6. Can go home today if voids, if no void will send home with foley and urology follow up  [facility],Caniya Tagle 05/26/2015

## 2015-06-02 DIAGNOSIS — B351 Tinea unguium: Secondary | ICD-10-CM | POA: Diagnosis not present

## 2015-07-22 ENCOUNTER — Other Ambulatory Visit: Payer: Self-pay | Admitting: Chiropractic Medicine

## 2015-07-22 ENCOUNTER — Ambulatory Visit
Admission: RE | Admit: 2015-07-22 | Discharge: 2015-07-22 | Disposition: A | Discharge status: Home / Self Care | Payer: Medicare Other | Source: Ambulatory Visit | Attending: Chiropractic Medicine | Admitting: Chiropractic Medicine

## 2015-07-22 DIAGNOSIS — M545 Low back pain: Secondary | ICD-10-CM

## 2015-07-22 MED ORDER — GADOBENATE DIMEGLUMINE 529 MG/ML IV SOLN
20.0000 mL | Freq: Once | INTRAVENOUS | Status: AC | PRN
  Administered 2015-07-22: 20 mL via INTRAVENOUS

## 2016-06-29 ENCOUNTER — Inpatient Hospital Stay (HOSPITAL_COMMUNITY)
Admission: EM | Admit: 2016-06-29 | Discharge: 2016-07-02 | DRG: 190 | Disposition: A | Discharge status: Home / Self Care | Payer: Medicare Other | Attending: Internal Medicine | Admitting: Internal Medicine

## 2016-06-29 ENCOUNTER — Emergency Department (HOSPITAL_COMMUNITY): Payer: Medicare Other

## 2016-06-29 ENCOUNTER — Encounter (HOSPITAL_COMMUNITY): Payer: Self-pay | Admitting: Nurse Practitioner

## 2016-06-29 DIAGNOSIS — I509 Heart failure, unspecified: Secondary | ICD-10-CM | POA: Diagnosis present

## 2016-06-29 DIAGNOSIS — F1020 Alcohol dependence, uncomplicated: Secondary | ICD-10-CM | POA: Diagnosis present

## 2016-06-29 DIAGNOSIS — Z79899 Other long term (current) drug therapy: Secondary | ICD-10-CM

## 2016-06-29 DIAGNOSIS — Z79891 Long term (current) use of opiate analgesic: Secondary | ICD-10-CM | POA: Diagnosis not present

## 2016-06-29 DIAGNOSIS — G4733 Obstructive sleep apnea (adult) (pediatric): Secondary | ICD-10-CM | POA: Diagnosis present

## 2016-06-29 DIAGNOSIS — Z880 Allergy status to penicillin: Secondary | ICD-10-CM | POA: Diagnosis not present

## 2016-06-29 DIAGNOSIS — I10 Essential (primary) hypertension: Secondary | ICD-10-CM | POA: Diagnosis present

## 2016-06-29 DIAGNOSIS — F419 Anxiety disorder, unspecified: Secondary | ICD-10-CM | POA: Diagnosis present

## 2016-06-29 DIAGNOSIS — J9601 Acute respiratory failure with hypoxia: Secondary | ICD-10-CM | POA: Diagnosis present

## 2016-06-29 DIAGNOSIS — Z87891 Personal history of nicotine dependence: Secondary | ICD-10-CM | POA: Diagnosis not present

## 2016-06-29 DIAGNOSIS — I110 Hypertensive heart disease with heart failure: Secondary | ICD-10-CM | POA: Diagnosis present

## 2016-06-29 DIAGNOSIS — Z888 Allergy status to other drugs, medicaments and biological substances status: Secondary | ICD-10-CM | POA: Diagnosis not present

## 2016-06-29 DIAGNOSIS — R0602 Shortness of breath: Secondary | ICD-10-CM | POA: Insufficient documentation

## 2016-06-29 DIAGNOSIS — J441 Chronic obstructive pulmonary disease with (acute) exacerbation: Principal | ICD-10-CM | POA: Diagnosis present

## 2016-06-29 DIAGNOSIS — F329 Major depressive disorder, single episode, unspecified: Secondary | ICD-10-CM | POA: Diagnosis present

## 2016-06-29 DIAGNOSIS — Z86718 Personal history of other venous thrombosis and embolism: Secondary | ICD-10-CM | POA: Diagnosis not present

## 2016-06-29 DIAGNOSIS — I2510 Atherosclerotic heart disease of native coronary artery without angina pectoris: Secondary | ICD-10-CM | POA: Diagnosis present

## 2016-06-29 DIAGNOSIS — E785 Hyperlipidemia, unspecified: Secondary | ICD-10-CM | POA: Diagnosis present

## 2016-06-29 LAB — CBC WITH DIFFERENTIAL/PLATELET
Basophils Absolute: 0.1 10*3/uL (ref 0.0–0.1)
Basophils Relative: 1 %
EOS PCT: 2 %
Eosinophils Absolute: 0.2 10*3/uL (ref 0.0–0.7)
HEMATOCRIT: 46.3 % (ref 39.0–52.0)
HEMOGLOBIN: 16.7 g/dL (ref 13.0–17.0)
LYMPHS ABS: 1.7 10*3/uL (ref 0.7–4.0)
LYMPHS PCT: 13 %
MCH: 33.3 pg (ref 26.0–34.0)
MCHC: 36.1 g/dL — ABNORMAL HIGH (ref 30.0–36.0)
MCV: 92.2 fL (ref 78.0–100.0)
Monocytes Absolute: 1.1 10*3/uL — ABNORMAL HIGH (ref 0.1–1.0)
Monocytes Relative: 9 %
NEUTROS ABS: 9.5 10*3/uL — AB (ref 1.7–7.7)
NEUTROS PCT: 75 %
Platelets: 225 10*3/uL (ref 150–400)
RBC: 5.02 MIL/uL (ref 4.22–5.81)
RDW: 12.4 % (ref 11.5–15.5)
WBC: 12.5 10*3/uL — AB (ref 4.0–10.5)

## 2016-06-29 LAB — BASIC METABOLIC PANEL
ANION GAP: 12 (ref 5–15)
BUN: 13 mg/dL (ref 6–20)
CHLORIDE: 99 mmol/L — AB (ref 101–111)
CO2: 25 mmol/L (ref 22–32)
Calcium: 9 mg/dL (ref 8.9–10.3)
Creatinine, Ser: 0.9 mg/dL (ref 0.61–1.24)
GFR calc non Af Amer: >60 mL/min (ref >60)
Glucose, Bld: 107 mg/dL — ABNORMAL HIGH (ref 65–99)
POTASSIUM: 3.8 mmol/L (ref 3.5–5.1)
SODIUM: 136 mmol/L (ref 135–145)

## 2016-06-29 LAB — I-STAT TROPONIN, ED: Troponin i, poc: 0.01 ng/mL (ref 0.00–0.08)

## 2016-06-29 LAB — BRAIN NATRIURETIC PEPTIDE: B NATRIURETIC PEPTIDE 5: 43 pg/mL (ref 0.0–100.0)

## 2016-06-29 MED ORDER — ACETAMINOPHEN 650 MG RE SUPP: 650.0000 mg | Freq: Four times a day (QID) | RECTAL | Status: DC | PRN

## 2016-06-29 MED ORDER — TEMAZEPAM 15 MG PO CAPS
30.0000 mg | ORAL_CAPSULE | Freq: Every day | ORAL | Status: DC
  Administered 2016-06-30 – 2016-07-01 (×3): 30 mg via ORAL
  Filled 2016-06-29 (×3): qty 2

## 2016-06-29 MED ORDER — SERTRALINE HCL 100 MG PO TABS
100.0000 mg | ORAL_TABLET | ORAL | Status: DC
  Administered 2016-06-30 – 2016-07-02 (×2): 100 mg via ORAL
  Filled 2016-06-29 (×2): qty 1

## 2016-06-29 MED ORDER — OXYCODONE HCL 5 MG PO TABS
5.0000 mg | ORAL_TABLET | ORAL | Status: DC | PRN
  Administered 2016-06-30 (×2): 10 mg via ORAL
  Filled 2016-06-29 (×2): qty 2

## 2016-06-29 MED ORDER — METHYLPREDNISOLONE SODIUM SUCC 40 MG IJ SOLR
40.0000 mg | Freq: Two times a day (BID) | INTRAMUSCULAR | Status: DC
  Administered 2016-06-29 – 2016-06-30 (×2): 40 mg via INTRAVENOUS
  Filled 2016-06-29 (×2): qty 1

## 2016-06-29 MED ORDER — POTASSIUM 99 MG PO TABS: 99.0000 mg | ORAL_TABLET | Freq: Every day | ORAL | Status: DC

## 2016-06-29 MED ORDER — CARVEDILOL 3.125 MG PO TABS
3.1250 mg | ORAL_TABLET | Freq: Two times a day (BID) | ORAL | Status: DC
  Administered 2016-06-30 – 2016-07-02 (×5): 3.125 mg via ORAL
  Filled 2016-06-29 (×5): qty 1

## 2016-06-29 MED ORDER — IPRATROPIUM-ALBUTEROL 0.5-2.5 (3) MG/3ML IN SOLN
3.0000 mL | RESPIRATORY_TRACT | Status: DC
  Administered 2016-06-29: 3 mL via RESPIRATORY_TRACT
  Filled 2016-06-29: qty 3

## 2016-06-29 MED ORDER — ENOXAPARIN SODIUM 40 MG/0.4ML ~~LOC~~ SOLN
40.0000 mg | Freq: Every day | SUBCUTANEOUS | Status: DC
  Administered 2016-06-30 – 2016-07-02 (×3): 40 mg via SUBCUTANEOUS
  Filled 2016-06-29 (×3): qty 0.4

## 2016-06-29 MED ORDER — BUDESONIDE 0.25 MG/2ML IN SUSP
0.2500 mg | Freq: Two times a day (BID) | RESPIRATORY_TRACT | Status: DC
  Administered 2016-06-29 – 2016-07-02 (×6): 0.25 mg via RESPIRATORY_TRACT
  Filled 2016-06-29 (×6): qty 2

## 2016-06-29 MED ORDER — IPRATROPIUM-ALBUTEROL 0.5-2.5 (3) MG/3ML IN SOLN
3.0000 mL | Freq: Three times a day (TID) | RESPIRATORY_TRACT | Status: DC
  Administered 2016-06-30 – 2016-07-02 (×7): 3 mL via RESPIRATORY_TRACT
  Filled 2016-06-29 (×7): qty 3

## 2016-06-29 MED ORDER — ALPRAZOLAM 0.25 MG PO TABS
0.2500 mg | ORAL_TABLET | Freq: Three times a day (TID) | ORAL | Status: DC | PRN
  Administered 2016-06-29: 0.25 mg via ORAL
  Filled 2016-06-29: qty 1

## 2016-06-29 MED ORDER — GUAIFENESIN ER 600 MG PO TB12
600.0000 mg | ORAL_TABLET | Freq: Two times a day (BID) | ORAL | Status: DC | PRN
  Administered 2016-06-29: 600 mg via ORAL
  Filled 2016-06-29: qty 1

## 2016-06-29 MED ORDER — ONDANSETRON HCL 4 MG PO TABS: 4.0000 mg | ORAL_TABLET | Freq: Four times a day (QID) | ORAL | Status: DC | PRN

## 2016-06-29 MED ORDER — GABAPENTIN 300 MG PO CAPS
300.0000 mg | ORAL_CAPSULE | Freq: Every day | ORAL | Status: DC
  Administered 2016-06-29 – 2016-07-01 (×3): 300 mg via ORAL
  Filled 2016-06-29 (×3): qty 1

## 2016-06-29 MED ORDER — ALBUTEROL SULFATE (2.5 MG/3ML) 0.083% IN NEBU
5.0000 mg | INHALATION_SOLUTION | Freq: Once | RESPIRATORY_TRACT | Status: AC
  Administered 2016-06-29: 5 mg via RESPIRATORY_TRACT
  Filled 2016-06-29: qty 6

## 2016-06-29 MED ORDER — ARIPIPRAZOLE 2 MG PO TABS
2.0000 mg | ORAL_TABLET | Freq: Every day | ORAL | Status: DC
  Administered 2016-06-30 – 2016-07-02 (×3): 2 mg via ORAL
  Filled 2016-06-29 (×3): qty 1

## 2016-06-29 MED ORDER — DOXYCYCLINE HYCLATE 100 MG IV SOLR
100.0000 mg | Freq: Two times a day (BID) | INTRAVENOUS | Status: DC
  Administered 2016-06-30: 100 mg via INTRAVENOUS
  Filled 2016-06-29 (×3): qty 100

## 2016-06-29 MED ORDER — ONDANSETRON HCL 4 MG/2ML IJ SOLN: 4.0000 mg | Freq: Four times a day (QID) | INTRAMUSCULAR | Status: DC | PRN

## 2016-06-29 MED ORDER — IPRATROPIUM-ALBUTEROL 0.5-2.5 (3) MG/3ML IN SOLN
3.0000 mL | RESPIRATORY_TRACT | Status: DC | PRN
  Administered 2016-06-30: 3 mL via RESPIRATORY_TRACT
  Filled 2016-06-29: qty 3

## 2016-06-29 MED ORDER — ZOLPIDEM TARTRATE 5 MG PO TABS
5.0000 mg | ORAL_TABLET | Freq: Every evening | ORAL | Status: DC | PRN
  Administered 2016-06-29 – 2016-07-01 (×2): 5 mg via ORAL
  Filled 2016-06-29 (×2): qty 1

## 2016-06-29 MED ORDER — IPRATROPIUM-ALBUTEROL 0.5-2.5 (3) MG/3ML IN SOLN
3.0000 mL | Freq: Once | RESPIRATORY_TRACT | Status: AC
  Administered 2016-06-29: 3 mL via RESPIRATORY_TRACT
  Filled 2016-06-29: qty 3

## 2016-06-29 MED ORDER — PANTOPRAZOLE SODIUM 40 MG PO TBEC
40.0000 mg | DELAYED_RELEASE_TABLET | Freq: Every day | ORAL | Status: DC
  Administered 2016-06-30 – 2016-07-02 (×3): 40 mg via ORAL
  Filled 2016-06-29 (×3): qty 1

## 2016-06-29 MED ORDER — ASPIRIN EC 81 MG PO TBEC
81.0000 mg | DELAYED_RELEASE_TABLET | Freq: Every day | ORAL | Status: DC
  Administered 2016-06-30 – 2016-07-02 (×3): 81 mg via ORAL
  Filled 2016-06-29 (×3): qty 1

## 2016-06-29 MED ORDER — MAGNESIUM OXIDE 400 (241.3 MG) MG PO TABS
400.0000 mg | ORAL_TABLET | ORAL | Status: DC
Start: 2016-06-30 — End: 2016-07-02
  Administered 2016-06-30 – 2016-07-02 (×2): 400 mg via ORAL
  Filled 2016-06-29 (×2): qty 1

## 2016-06-29 MED ORDER — ACETAMINOPHEN 325 MG PO TABS: 650.0000 mg | ORAL_TABLET | Freq: Four times a day (QID) | ORAL | Status: DC | PRN

## 2016-06-29 MED ORDER — IRBESARTAN 75 MG PO TABS
75.0000 mg | ORAL_TABLET | Freq: Every day | ORAL | Status: DC
  Administered 2016-06-30 – 2016-07-02 (×3): 75 mg via ORAL
  Filled 2016-06-29 (×3): qty 1
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
## 2016-06-29 NOTE — ED Triage Notes (Signed)
Per EMS Pt from home was walking around the house and felt very shortness of breath. Patient was recently treated for pneumonia and completed z-pack yesterday. Patient noted progressive shortness of breath over the last week that was acutely worse today. Patient check SpO2 on home monitor stated it was 49%/RA so patient called 911.  Patient has productive cough with yellow phlegm. Pt denies fevers, chills, chest pain.   Patient received 10mg  of albuterol and 0.5mg  of atrovent neb treatment and 125mg  of IV solumederol.  Pt breath sounds diminished throughout intially after breathing treatments wheezing auscultated at bases. Pt has hx of COPD and cardiac arrest.

## 2016-06-29 NOTE — ED Notes (Signed)
Pt transported to xray 
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
## 2016-06-29 NOTE — ED Notes (Signed)
Attempted report 

## 2016-06-29 NOTE — Progress Notes (Signed)
Pt. In no distress. BBS are clear and diminished. Pt. Does not take any tx at home. Pt. Tolerating 3L [place] at this time.

## 2016-06-30 DIAGNOSIS — J441 Chronic obstructive pulmonary disease with (acute) exacerbation: Principal | ICD-10-CM

## 2016-06-30 DIAGNOSIS — G4733 Obstructive sleep apnea (adult) (pediatric): Secondary | ICD-10-CM

## 2016-06-30 DIAGNOSIS — I10 Essential (primary) hypertension: Secondary | ICD-10-CM

## 2016-06-30 DIAGNOSIS — J9601 Acute respiratory failure with hypoxia: Secondary | ICD-10-CM

## 2016-06-30 LAB — BASIC METABOLIC PANEL
ANION GAP: 9 (ref 5–15)
BUN: 13 mg/dL (ref 6–20)
CALCIUM: 8.8 mg/dL — AB (ref 8.9–10.3)
CHLORIDE: 100 mmol/L — AB (ref 101–111)
CO2: 26 mmol/L (ref 22–32)
Creatinine, Ser: 0.81 mg/dL (ref 0.61–1.24)
GFR calc non Af Amer: >60 mL/min (ref >60)
GLUCOSE: 206 mg/dL — AB (ref 65–99)
Potassium: 4.3 mmol/L (ref 3.5–5.1)
Sodium: 135 mmol/L (ref 135–145)

## 2016-06-30 LAB — CBC
HCT: 43.2 % (ref 39.0–52.0)
Hemoglobin: 14.9 g/dL (ref 13.0–17.0)
MCH: 31.7 pg (ref 26.0–34.0)
MCHC: 34.5 g/dL (ref 30.0–36.0)
MCV: 91.9 fL (ref 78.0–100.0)
Platelets: 227 10*3/uL (ref 150–400)
RBC: 4.7 MIL/uL (ref 4.22–5.81)
RDW: 12.1 % (ref 11.5–15.5)
WBC: 12.3 10*3/uL — ABNORMAL HIGH (ref 4.0–10.5)

## 2016-06-30 MED ORDER — GUAIFENESIN ER 600 MG PO TB12
1200.0000 mg | ORAL_TABLET | Freq: Two times a day (BID) | ORAL | Status: DC
  Administered 2016-06-30 – 2016-07-02 (×4): 1200 mg via ORAL
  Filled 2016-06-30 (×4): qty 2

## 2016-06-30 MED ORDER — FUROSEMIDE 10 MG/ML IJ SOLN
40.0000 mg | Freq: Once | INTRAMUSCULAR | Status: AC
  Administered 2016-06-30: 40 mg via INTRAVENOUS
  Filled 2016-06-30: qty 4

## 2016-06-30 MED ORDER — METHYLPREDNISOLONE SODIUM SUCC 40 MG IJ SOLR
40.0000 mg | Freq: Three times a day (TID) | INTRAMUSCULAR | Status: DC
  Administered 2016-06-30 – 2016-07-02 (×6): 40 mg via INTRAVENOUS
  Filled 2016-06-30 (×6): qty 1

## 2016-06-30 NOTE — Progress Notes (Signed)
PROGRESS NOTE  Robert York  JXB:[id number] DOB: 07-Jul-1951 DOA: 06/29/2016 PCP: Georgann HousekeeperHUSAIN,KARRAR, MD Outpatient Specialists:  Subjective: Seen with his daughter at bedside, has SOB struggles to complete sentences.  Brief Narrative:  Robert York is a 65 y.o. 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 Patient has been getting wheezing and shortness of breath over the last 2 weeks which has progressively worsened. Patient's primary care had called in a Z-Pak which patient had taken last 4 days despite which symptoms have not improved. Patient found himself hypoxic at home and called the EMS. In the ER patient was requiring 4 L of oxygen to maintain sats more than 90%. Patient was found to have bilateral wheezing. Chest x-ray shows congestion. Patient is being admitted for COPD exacerbation. Patient denies any chest pain. Has benign some productive cough.   Assessment & Plan:   Active Problems:   Essential hypertension   COPD exacerbation (HCC)   Acute respiratory failure with hypoxia (HCC)   OSA (obstructive sleep apnea)   Acute respiratory failure with hypoxia -Patient presented with a COPD exacerbation, oxygen saturation within the low 80s. -Required 4 L of supplemental oxygen to keep O2 above 90%. -Continue treatment for COPD, continue supplemental oxygen, wean as tolerated.  Acute COPD exacerbation -Presented with shortness of breath, cough and wheezing. CXR showed no evidence of pneumonia -Started on antibiotics and IV steroids. -Continue supportive management with bronchodilators, mucolytics, antitussives and oxygen as needed.  Essential hypertension -Continue home medications including Coreg and ARB.  Tobacco abuse -Counseled extensively about smoking quitting.  OSA    DVT prophylaxis:  Code Status: Full Code Family Communication:    Disposition Plan:  Diet: Diet Heart Room service appropriate? Yes; Fluid consistency: Thin  Consultants:   None  Procedures:   None  Antimicrobials:   Levofloxacin   Objective: Vitals:   06/29/16 2134 06/29/16 2336 06/30/16 0455 06/30/16 0813  BP: 139/64  131/81   Pulse: 72  73   Resp: 19  18   Temp: 98.2 F (36.8 C)  98.1 F (36.7 C)   TempSrc: Oral     SpO2: 93% 95% 97% (!) 86%  Weight: 108.9 kg (240 lb)     Height: 5\' 11"  (1.803 m)       Intake/Output Summary (Last 24 hours) at 06/30/16 1228 Last data filed at 06/30/16 0300  Gross per 24 hour  Intake              370 ml  Output                0 ml  Net              370 ml   Filed Weights   06/29/16 1659 06/29/16 2134  Weight: 113.4 kg (250 lb) 108.9 kg (240 lb)    Examination: General exam: Appears calm and comfortable  Respiratory system: Clear to auscultation. Respiratory effort normal. Cardiovascular system: S1 & S2 heard, RRR. No JVD, murmurs, rubs, gallops or clicks. No pedal edema. Gastrointestinal system: Abdomen is nondistended, soft and nontender. No organomegaly or masses felt. Normal bowel sounds heard. Central nervous system: Alert and oriented. No focal neurological deficits. Extremities: Symmetric 5 x 5 power. Skin: No rashes, lesions or ulcers Psychiatry: Judgement and insight appear normal. Mood & affect appropriate.   Data Reviewed: I have personally reviewed following labs and  imaging studies  CBC:  Recent Labs Lab 06/29/16 1706 06/30/16 0539  WBC 12.5* 12.3*  NEUTROABS 9.5*  --   HGB 16.7 14.9  HCT 46.3 43.2  MCV 92.2 91.9  PLT 225 227   Basic Metabolic Panel:  Recent Labs Lab 06/29/16 1706 06/30/16 0539  NA 136 135  K 3.8 4.3  CL 99* 100*  CO2 25 26  GLUCOSE 107* 206*  BUN 13 13  CREATININE 0.90 0.81  CALCIUM 9.0 8.8*   GFR: Estimated Creatinine Clearance: 111 mL/min (by C-G formula based on SCr of 0.81 mg/dL). Liver Function Tests: No results for input(s):  AST, ALT, ALKPHOS, BILITOT, PROT, ALBUMIN in the last 168 hours. No results for input(s): LIPASE, AMYLASE in the last 168 hours. No results for input(s): AMMONIA in the last 168 hours. Coagulation Profile: No results for input(s): INR, PROTIME in the last 168 hours. Cardiac Enzymes: No results for input(s): CKTOTAL, CKMB, CKMBINDEX, TROPONINI in the last 168 hours. BNP (last 3 results) No results for input(s): PROBNP in the last 8760 hours. HbA1C: No results for input(s): HGBA1C in the last 72 hours. CBG: No results for input(s): GLUCAP in the last 168 hours. Lipid Profile: No results for input(s): CHOL, HDL, LDLCALC, TRIG, CHOLHDL, LDLDIRECT in the last 72 hours. Thyroid Function Tests: No results for input(s): TSH, T4TOTAL, FREET4, T3FREE, THYROIDAB in the last 72 hours. Anemia Panel: No results for input(s): VITAMINB12, FOLATE, FERRITIN, TIBC, IRON, RETICCTPCT in the last 72 hours. Urine analysis:    Component Value Date/Time   COLORURINE AMBER (A) 05/11/2013 2238   APPEARANCEUR CLEAR 05/11/2013 2238   LABSPEC 1.024 05/11/2013 2238   PHURINE 5.5 05/11/2013 2238   GLUCOSEU NEGATIVE 05/11/2013 2238   HGBUR NEGATIVE 05/11/2013 2238   BILIRUBINUR NEGATIVE 05/11/2013 2238   KETONESUR NEGATIVE 05/11/2013 2238   PROTEINUR NEGATIVE 05/11/2013 2238   UROBILINOGEN 1.0 05/11/2013 2238   NITRITE NEGATIVE 05/11/2013 2238   LEUKOCYTESUR NEGATIVE 05/11/2013 2238   Sepsis Labs: @LABRCNTIP (procalcitonin:4,lacticidven:4)  )No results found for this or any previous visit (from the past 240 hour(s)).   Invalid input(s): PROCALCITONIN, LACTICACIDVEN   Radiology Studies: Dg Chest 2 View  Result Date: 06/29/2016 CLINICAL DATA:  Acute onset of shortness of breath today. Recent treatment for pneumonia. Progressive shortness of breath for 1 week, acutely worsened today. EXAM: CHEST  2 VIEW COMPARISON:  Most recent exam 05/12/2013 FINDINGS: Unchanged chronic cardiomegaly. Fluffy perihilar and  bibasilar opacities concerning for pulmonary edema. Streaky bibasilar opacities more peripherally. Minimal fluid in the fissures without large subpulmonic effusion. No pneumothorax. No acute osseous abnormality is seen. IMPRESSION: 1. Cardiomegaly with probable pulmonary edema. Minimal fluid in the fissure. Suspect fluid overload/CHF. 2. Streaky bibasilar opacities, favoring atelectasis, pneumonia felt less likely. Electronically Signed   By: Rubye Oaks M.D.   On: 06/29/2016 18:01        Scheduled Meds: . ARIPiprazole  2 mg Oral Q breakfast  . aspirin EC  81 mg Oral Daily  . budesonide (PULMICORT) nebulizer solution  0.25 mg Nebulization BID  . carvedilol  3.125 mg Oral BID WC  . doxycycline (VIBRAMYCIN) IV  100 mg Intravenous BID  . enoxaparin (LOVENOX) injection  40 mg Subcutaneous Daily  . gabapentin  300 mg Oral QHS  . ipratropium-albuterol  3 mL Nebulization TID  . irbesartan  75 mg Oral Daily  . magnesium oxide  400 mg Oral QODAY  . methylPREDNISolone (SOLU-MEDROL) injection  40 mg Intravenous BID  . pantoprazole  40 mg Oral  Daily  . sertraline  100 mg Oral QODAY  . temazepam  30 mg Oral QHS   Continuous Infusions:   LOS: 1 day    Time spent: 35 minutes    Caleb Decock A, MD [facility] Pager [phone]  If 7PM-7AM, please contact night-coverage [email or web address] Password [email or web address] 06/30/2016, 12:28 PM

## 2016-07-01 LAB — CBC
HCT: 44.4 % (ref 39.0–52.0)
HEMOGLOBIN: 14.8 g/dL (ref 13.0–17.0)
MCH: 31.4 pg (ref 26.0–34.0)
MCHC: 33.3 g/dL (ref 30.0–36.0)
MCV: 94.3 fL (ref 78.0–100.0)
Platelets: 260 10*3/uL (ref 150–400)
RBC: 4.71 MIL/uL (ref 4.22–5.81)
RDW: 12.5 % (ref 11.5–15.5)
WBC: 16.7 10*3/uL — ABNORMAL HIGH (ref 4.0–10.5)

## 2016-07-01 LAB — BASIC METABOLIC PANEL
ANION GAP: 10 (ref 5–15)
BUN: 22 mg/dL — ABNORMAL HIGH (ref 6–20)
CALCIUM: 9 mg/dL (ref 8.9–10.3)
CO2: 25 mmol/L (ref 22–32)
CREATININE: 0.85 mg/dL (ref 0.61–1.24)
Chloride: 101 mmol/L (ref 101–111)
GFR calc Af Amer: >60 mL/min (ref >60)
GFR calc non Af Amer: >60 mL/min (ref >60)
GLUCOSE: 232 mg/dL — AB (ref 65–99)
Potassium: 4.4 mmol/L (ref 3.5–5.1)
Sodium: 136 mmol/L (ref 135–145)

## 2016-07-01 MED ORDER — FUROSEMIDE 10 MG/ML IJ SOLN
40.0000 mg | Freq: Once | INTRAMUSCULAR | Status: AC
  Administered 2016-07-01: 40 mg via INTRAVENOUS
  Filled 2016-07-01: qty 4

## 2016-07-01 NOTE — Progress Notes (Signed)
PROGRESS NOTE  Robert PoreDaniel L Brooker  ZOX:[id number] DOB: [birth year] DOA: 06/29/2016 PCP: Georgann HousekeeperHUSAIN,KARRAR, MD Outpatient Specialists:  Subjective: Feels much better today, denies any new complaints. Will repeat Lasix, continue ambulation and current respiratory management  Brief Narrative:  Robert York is a 65 y.o. 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 Patient has been getting wheezing and shortness of breath over the last 2 weeks which has progressively worsened. Patient's primary care had called in a Z-Pak which patient had taken last 4 days despite which symptoms have not improved. Patient found himself hypoxic at home and called the EMS. In the ER patient was requiring 4 L of oxygen to maintain sats more than 90%. Patient was found to have bilateral wheezing. Chest x-ray shows congestion. Patient is being admitted for COPD exacerbation. Patient denies any chest pain. Has benign some productive cough.   Assessment & Plan:   Active Problems:   Essential hypertension   COPD exacerbation (HCC)   Acute respiratory failure with hypoxia (HCC)   OSA (obstructive sleep apnea)   Acute respiratory failure with hypoxia -Patient presented with a COPD exacerbation, oxygen saturation within the low 80s. -Required 4 L of supplemental oxygen to keep O2 above 90%. -Continue treatment for COPD, continue supplemental oxygen, wean as tolerated. -Better than yesterday 2 L of oxygen currently, try to wean as tolerated.  Acute COPD exacerbation -Presented with shortness of breath, cough and wheezing. CXR showed no evidence of pneumonia -Started on antibiotics and IV steroids. -Continue supportive management with bronchodilators, mucolytics, antitussives and oxygen as needed.  Essential hypertension -Continue home medications including Coreg and ARB.  Tobacco  abuse -Counseled extensively about smoking quitting.  OSA    DVT prophylaxis:  Code Status: Full Code Family Communication:  Disposition Plan:  Diet: Diet Heart Room service appropriate? Yes; Fluid consistency: Thin  Consultants:   None  Procedures:   None  Antimicrobials:   Levofloxacin   Objective: Vitals:   06/30/16 1508 06/30/16 2055 07/01/16 0445 07/01/16 0944  BP:  (!) 141/65 123/70   Pulse:  (!) 59 (!) 51   Resp:  20 19   Temp:  98.3 F (36.8 C) 98.4 F (36.9 C)   TempSrc:      SpO2: 90% 93% 92% (!) 88%  Weight:      Height:        Intake/Output Summary (Last 24 hours) at 07/01/16 1120 Last data filed at 07/01/16 0729  Gross per 24 hour  Intake              480 ml  Output                0 ml  Net              480 ml   Filed Weights   06/29/16 1659 06/29/16 2134  Weight: 113.4 kg (250 lb) 108.9 kg (240 lb)    Examination: General exam: Appears calm and comfortable  Respiratory system: Clear to auscultation. Respiratory effort normal. Cardiovascular system: S1 & S2 heard, RRR. No JVD, murmurs, rubs, gallops or clicks. No pedal edema. Gastrointestinal system: Abdomen is nondistended, soft and nontender. No organomegaly or masses felt. Normal bowel sounds heard. Central nervous system: Alert and oriented. No focal neurological deficits. Extremities: Symmetric 5 x 5 power. Skin: No rashes, lesions or ulcers Psychiatry: Judgement and insight appear normal. Mood &  affect appropriate.   Data Reviewed: I have personally reviewed following labs and imaging studies  CBC:  Recent Labs Lab 06/29/16 1706 06/30/16 0539 07/01/16 0434  WBC 12.5* 12.3* 16.7*  NEUTROABS 9.5*  --   --   HGB 16.7 14.9 14.8  HCT 46.3 43.2 44.4  MCV 92.2 91.9 94.3  PLT 225 227 260   Basic Metabolic Panel:  Recent Labs Lab 06/29/16 1706 06/30/16 0539 07/01/16 0434  NA 136 135 136  K 3.8 4.3 4.4  CL 99* 100* 101  CO2 25 26 25   GLUCOSE 107* 206* 232*  BUN 13 13  22*  CREATININE 0.90 0.81 0.85  CALCIUM 9.0 8.8* 9.0   GFR: Estimated Creatinine Clearance: 105.8 mL/min (by C-G formula based on SCr of 0.85 mg/dL). Liver Function Tests: No results for input(s): AST, ALT, ALKPHOS, BILITOT, PROT, ALBUMIN in the last 168 hours. No results for input(s): LIPASE, AMYLASE in the last 168 hours. No results for input(s): AMMONIA in the last 168 hours. Coagulation Profile: No results for input(s): INR, PROTIME in the last 168 hours. Cardiac Enzymes: No results for input(s): CKTOTAL, CKMB, CKMBINDEX, TROPONINI in the last 168 hours. BNP (last 3 results) No results for input(s): PROBNP in the last 8760 hours. HbA1C: No results for input(s): HGBA1C in the last 72 hours. CBG: No results for input(s): GLUCAP in the last 168 hours. Lipid Profile: No results for input(s): CHOL, HDL, LDLCALC, TRIG, CHOLHDL, LDLDIRECT in the last 72 hours. Thyroid Function Tests: No results for input(s): TSH, T4TOTAL, FREET4, T3FREE, THYROIDAB in the last 72 hours. Anemia Panel: No results for input(s): VITAMINB12, FOLATE, FERRITIN, TIBC, IRON, RETICCTPCT in the last 72 hours. Urine analysis:    Component Value Date/Time   COLORURINE AMBER (A) 05/11/2013 2238   APPEARANCEUR CLEAR 05/11/2013 2238   LABSPEC 1.024 05/11/2013 2238   PHURINE 5.5 05/11/2013 2238   GLUCOSEU NEGATIVE 05/11/2013 2238   HGBUR NEGATIVE 05/11/2013 2238   BILIRUBINUR NEGATIVE 05/11/2013 2238   KETONESUR NEGATIVE 05/11/2013 2238   PROTEINUR NEGATIVE 05/11/2013 2238   UROBILINOGEN 1.0 05/11/2013 2238   NITRITE NEGATIVE 05/11/2013 2238   LEUKOCYTESUR NEGATIVE 05/11/2013 2238   Sepsis Labs: @LABRCNTIP (procalcitonin:4,lacticidven:4)  )No results found for this or any previous visit (from the past 240 hour(s)).   Invalid input(s): PROCALCITONIN, LACTICACIDVEN   Radiology Studies: Dg Chest 2 View  Result Date: 06/29/2016 CLINICAL DATA:  Acute onset of shortness of breath today. Recent treatment for  pneumonia. Progressive shortness of breath for 1 week, acutely worsened today. EXAM: CHEST  2 VIEW COMPARISON:  Most recent exam 05/12/2013 FINDINGS: Unchanged chronic cardiomegaly. Fluffy perihilar and bibasilar opacities concerning for pulmonary edema. Streaky bibasilar opacities more peripherally. Minimal fluid in the fissures without large subpulmonic effusion. No pneumothorax. No acute osseous abnormality is seen. IMPRESSION: 1. Cardiomegaly with probable pulmonary edema. Minimal fluid in the fissure. Suspect fluid overload/CHF. 2. Streaky bibasilar opacities, favoring atelectasis, pneumonia felt less likely. Electronically Signed   By: Rubye OaksMelanie  Ehinger M.D.   On: 06/29/2016 18:01        Scheduled Meds: . ARIPiprazole  2 mg Oral Q breakfast  . aspirin EC  81 mg Oral Daily  . budesonide (PULMICORT) nebulizer solution  0.25 mg Nebulization BID  . carvedilol  3.125 mg Oral BID WC  . enoxaparin (LOVENOX) injection  40 mg Subcutaneous Daily  . gabapentin  300 mg Oral QHS  . guaiFENesin  1,200 mg Oral BID  . ipratropium-albuterol  3 mL Nebulization TID  . irbesartan  75 mg Oral Daily  . magnesium oxide  400 mg Oral QODAY  . methylPREDNISolone (SOLU-MEDROL) injection  40 mg Intravenous TID  . pantoprazole  40 mg Oral Daily  . sertraline  100 mg Oral QODAY  . temazepam  30 mg Oral QHS   Continuous Infusions:   LOS: 2 days    Time spent: 35 minutes    Jesusmanuel Erbes A, MD [facility] Pager [phone]  If 7PM-7AM, please contact night-coverage [email or web address] Password [email or web address] 07/01/2016, 11:20 AM

## 2016-07-02 LAB — BASIC METABOLIC PANEL
Anion gap: 10 (ref 5–15)
BUN: 22 mg/dL — ABNORMAL HIGH (ref 6–20)
CALCIUM: 8.9 mg/dL (ref 8.9–10.3)
CHLORIDE: 99 mmol/L — AB (ref 101–111)
CO2: 25 mmol/L (ref 22–32)
CREATININE: 0.81 mg/dL (ref 0.61–1.24)
GFR calc Af Amer: >60 mL/min (ref >60)
GFR calc non Af Amer: >60 mL/min (ref >60)
GLUCOSE: 212 mg/dL — AB (ref 65–99)
Potassium: 4.3 mmol/L (ref 3.5–5.1)
Sodium: 134 mmol/L — ABNORMAL LOW (ref 135–145)

## 2016-07-02 MED ORDER — LEVOFLOXACIN 750 MG PO TABS: 750.0000 mg | ORAL_TABLET | Freq: Every day | ORAL | 0 refills | Status: AC

## 2016-07-02 MED ORDER — FUROSEMIDE 10 MG/ML IJ SOLN
40.0000 mg | Freq: Once | INTRAMUSCULAR | Status: AC
  Administered 2016-07-02: 40 mg via INTRAVENOUS
  Filled 2016-07-02: qty 4

## 2016-07-02 MED ORDER — PREDNISONE 10 MG (21) PO TBPK: ORAL_TABLET | ORAL | 0 refills | Status: AC

## 2016-07-02 MED ORDER — FUROSEMIDE 20 MG PO TABS: 20.0000 mg | ORAL_TABLET | Freq: Every day | ORAL | 0 refills | Status: AC

## 2016-07-02 NOTE — Progress Notes (Signed)
Robert York to be D/C'd to home per MD order.  Discussed with the patient and all questions fully answered.  VSS, Skin clean, dry and intact without evidence of skin break York, no evidence of skin tears noted. IV catheter discontinued intact. Site without signs and symptoms of complications. Dressing and pressure applied.  An After Visit Summary was printed and given to the patient. Patient received prescriptions.  D/c education completed with patient/family including follow up instructions, medication list, d/c activities limitations if indicated, with other d/c instructions as indicated by MD - patient able to verbalize understanding, all questions fully answered.   Patient instructed to return to ED, call 911, or call MD for any changes in condition.   Patient escorted via WC, and D/C home via private auto.  Robert York 07/02/2016 1:22 PM

## 2016-07-02 NOTE — Discharge Summary (Signed)
Physician Discharge Summary  Robert PoreDaniel L York [id number] DOB: 08-20-1950 DOA: 06/29/2016  PCP: Georgann HousekeeperHUSAIN,KARRAR, MD  Admit date: 06/29/2016 Discharge date: 07/02/2016  Admitted From: Home Disposition: Home  Recommendations for Outpatient Follow-up:  1. Follow up with PCP in 1-2 weeks 2. Please obtain BMP/CBC in one week  Home Health: NA Equipment/Devices:NA  Discharge Condition: Stable CODE STATUS: Full Code Diet recommendation: Diet Heart Room service appropriate? 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 Patient has been getting wheezing and shortness of breath over the last 2 weeks which has progressively worsened. Patient's primary care had called in a Z-Pak which patient had taken last 4 days despite which symptoms have not improved. Patient found himself hypoxic at home and called the EMS. In the ER patient was requiring 4 L of oxygen to maintain sats more than 90%. Patient was found to have bilateral wheezing. Chest x-ray shows congestion. Patient is being admitted for COPD exacerbation. Patient denies any chest pain. Has benign some productive cough.  Discharge Diagnoses:  Active Problems:   Essential hypertension   COPD exacerbation (HCC)   Acute respiratory failure with hypoxia (HCC)   OSA (obstructive sleep apnea)    Acute respiratory failure with hypoxia -Patient presented with a COPD exacerbation, oxygen saturation within the low 80s. -Required 4 L of supplemental oxygen to keep O2 above 90%, On admission. -Treated for COPD, diuresed with Lasix to keep negative fluid intake. -Low 90s on room air at rest, on ambulation oxygen saturation dropped only to 92%.  Acute COPD exacerbation -Presented with shortness of breath,  cough and wheezing. CXR showed no evidence of pneumonia -Treated with NG antibiotics and IV steroids. -Supportive management with bronchodilators, mucolytics, antitussives and oxygen as needed. -On discharge levofloxacin for 5 more days and prednisone taper  Essential hypertension -Continue home medications including Coreg and ARB.  Tobacco abuse -Counseled extensively about smoking quitting.  OSA -Reported he did not wear his CPAP for the past 6 months, he does not have a machine now. -Needs follow-up with PCP.  CXR congestion -Patient has history of CAD, reportedly he was on Lasix before, his last 2-D echo showed LVEF of 55-60% -No diastolic dysfunction, but CXR showed some congestion this time, BNP was normal at 48. -Patient diuresed with IV Lasix, on discharge started low dose of Lasix at 20 mg daily. -Patient to follow-up with primary care physician to assess the need of diuresis. Not entirely consistent with CHF.   Discharge Instructions  Discharge Instructions    Diet - low sodium heart healthy    Complete by:  As directed    Increase activity slowly    Complete by:  As directed        Medication List    STOP taking these medications   naproxen sodium 220 MG tablet Commonly known as:  ANAPROX     TAKE these medications   albuterol 108 (90 Base) MCG/ACT inhaler Commonly known as:  PROVENTIL HFA;VENTOLIN HFA Inhale 2 puffs into the lungs every 6 (six) hours as needed for wheezing or shortness of breath.   ALPRAZolam 0.25 MG tablet Commonly known as:  XANAX Take 1 tablet (0.25 mg total) by mouth 3 (three) times daily as needed for anxiety.   ARIPiprazole 2 MG tablet Commonly known as:  ABILIFY Take 2 mg by mouth every morning.   aspirin EC 81 MG tablet Take 81 mg by mouth daily.   carvedilol 3.125 MG tablet Commonly known as:  COREG Take 3.125 mg by mouth 2 (two) times daily. What changed:  Another medication with the same name was removed. Continue  taking this medication, and follow the directions you see here.   FLOVENT DISKUS 250 MCG/BLIST Aepb Generic drug:  Fluticasone Propionate (Inhal) Inhale 1 puff into the lungs 2 (two) times daily.   furosemide 20 MG tablet Commonly known as:  LASIX Take 1 tablet (20 mg total) by mouth daily.   gabapentin 300 MG capsule Commonly known as:  NEURONTIN Take 300 mg by mouth at bedtime.   guaiFENesin 600 MG 12 hr tablet Commonly known as:  MUCINEX Take 600 mg by mouth 2 (two) times daily as needed for cough or to loosen phlegm.   levofloxacin 750 MG tablet Commonly known as:  LEVAQUIN Take 1 tablet (750 mg total) by mouth daily.   magnesium oxide 400 MG tablet Commonly known as:  MAG-OX Take 400 mg by mouth every other day.   omeprazole 20 MG capsule Commonly known as:  PRILOSEC Take 20 mg by mouth daily.   oxyCODONE 5 MG immediate release tablet Commonly known as:  Oxy IR/ROXICODONE Take 1-2 tablets (5-10 mg total) by mouth every 4 (four) hours as needed for moderate pain.   Potassium 99 MG Tabs Take 99 mg by mouth daily.   predniSONE 10 MG (21) Tbpk tablet Commonly known as:  STERAPRED UNI-PAK 21 TAB Take 6-5-4-3-2-1 PO orally till gone   sertraline 100 MG tablet Commonly known as:  ZOLOFT Take 100 mg by mouth every other day.   temazepam 30 MG capsule Commonly known as:  RESTORIL Take 30 mg by mouth at bedtime.   valsartan 80 MG tablet Commonly known as:  DIOVAN Take 80 mg by mouth daily.   zolpidem 10 MG tablet Commonly known as:  AMBIEN Take 10 mg by mouth at bedtime as needed for sleep.       Allergies  Allergen Reactions  . Lisinopril Other (See Comments)    Makes COPD worse, rare reaction-per patient.    . Amoxicillin Itching and Rash  . Neosporin [Neomycin-Bacitracin Zn-Polymyx] Rash  . Tape Rash    Cannot have tape in the same place for an extended period of time    Consultations:  None   Procedures (Echo, Carotid, EGD, Colonoscopy, ERCP)    Radiological studies: Dg Chest 2 View  Result Date: 06/29/2016 CLINICAL DATA:  Acute onset of shortness of breath today. Recent treatment for pneumonia. Progressive shortness of breath for 1 week, acutely worsened today. EXAM: CHEST  2 VIEW COMPARISON:  Most recent exam 05/12/2013 FINDINGS: Unchanged chronic cardiomegaly. Fluffy perihilar and bibasilar opacities concerning for pulmonary edema. Streaky bibasilar opacities more peripherally. Minimal fluid in the fissures without large subpulmonic effusion. No pneumothorax. No acute osseous abnormality is seen. IMPRESSION: 1. Cardiomegaly with probable pulmonary edema. Minimal fluid in the fissure. Suspect fluid overload/CHF. 2. Streaky bibasilar opacities, favoring atelectasis, pneumonia felt less likely. Electronically Signed   By: Rubye OaksMelanie  Ehinger M.D.   On: 06/29/2016 18:01     Subjective:  Discharge Exam: Vitals:   07/01/16 2236 07/02/16 0535 07/02/16 0757 07/02/16 0907  BP: 134/73 (!) 142/80 138/79   Pulse: (!) 58 (!) 58 (!) 57   Resp: 18 18    Temp: 97.7 F (36.5 C) 97.3 F (36.3 C)  TempSrc: Oral Oral    SpO2: 91% 94%  97%  Weight:      Height:       General: Pt is alert, awake, not in acute distress Cardiovascular: RRR, S1/S2 +, no rubs, no gallops Respiratory: CTA bilaterally, no wheezing, no rhonchi Abdominal: Soft, NT, ND, bowel sounds + Extremities: no edema, no cyanosis   The results of significant diagnostics from this hospitalization (including imaging, microbiology, ancillary and laboratory) are listed below for reference.    Microbiology: No results found for this or any previous visit (from the past 240 hour(s)).   Labs: BNP (last 3 results)  Recent Labs  06/29/16 1940  BNP 43.0   Basic Metabolic Panel:  Recent Labs Lab 06/29/16 1706 06/30/16 0539 07/01/16 0434 07/02/16 0631  NA 136 135 136 134*  K 3.8 4.3 4.4 4.3  CL 99* 100* 101 99*  CO2 25 26 25 25   GLUCOSE 107* 206* 232* 212*  BUN 13  13 22* 22*  CREATININE 0.90 0.81 0.85 0.81  CALCIUM 9.0 8.8* 9.0 8.9   Liver Function Tests: No results for input(s): AST, ALT, ALKPHOS, BILITOT, PROT, ALBUMIN in the last 168 hours. No results for input(s): LIPASE, AMYLASE in the last 168 hours. No results for input(s): AMMONIA in the last 168 hours. CBC:  Recent Labs Lab 06/29/16 1706 06/30/16 0539 07/01/16 0434  WBC 12.5* 12.3* 16.7*  NEUTROABS 9.5*  --   --   HGB 16.7 14.9 14.8  HCT 46.3 43.2 44.4  MCV 92.2 91.9 94.3  PLT 225 227 260   Cardiac Enzymes: No results for input(s): CKTOTAL, CKMB, CKMBINDEX, TROPONINI in the last 168 hours. BNP: Invalid input(s): POCBNP CBG: No results for input(s): GLUCAP in the last 168 hours. D-Dimer No results for input(s): DDIMER in the last 72 hours. Hgb A1c No results for input(s): HGBA1C in the last 72 hours. Lipid Profile No results for input(s): CHOL, HDL, LDLCALC, TRIG, CHOLHDL, LDLDIRECT in the last 72 hours. Thyroid function studies No results for input(s): TSH, T4TOTAL, T3FREE, THYROIDAB in the last 72 hours.  Invalid input(s): FREET3 Anemia work up No results for input(s): VITAMINB12, FOLATE, FERRITIN, TIBC, IRON, RETICCTPCT in the last 72 hours. Urinalysis    Component Value Date/Time   COLORURINE AMBER (A) 05/11/2013 2238   APPEARANCEUR CLEAR 05/11/2013 2238   LABSPEC 1.024 05/11/2013 2238   PHURINE 5.5 05/11/2013 2238   GLUCOSEU NEGATIVE 05/11/2013 2238   HGBUR NEGATIVE 05/11/2013 2238   BILIRUBINUR NEGATIVE 05/11/2013 2238   KETONESUR NEGATIVE 05/11/2013 2238   PROTEINUR NEGATIVE 05/11/2013 2238   UROBILINOGEN 1.0 05/11/2013 2238   NITRITE NEGATIVE 05/11/2013 2238   LEUKOCYTESUR NEGATIVE 05/11/2013 2238   Sepsis Labs Invalid input(s): PROCALCITONIN,  WBC,  LACTICIDVEN Microbiology No results found for this or any previous visit (from the past 240 hour(s)).   Time coordinating discharge: Over 30 minutes  SIGNED:   Clint Lipps, MD  [facility] 07/02/2016, 10:35 AM Pager   If 7PM-7AM, please contact night-coverage [email or web address] Password [email or web address]

## 2016-07-02 NOTE — Progress Notes (Signed)
SATURATION QUALIFICATIONS: (This note is used to comply with regulatory documentation for home oxygen)  Patient Saturations on Room Air at Rest = 94%  Patient Saturations on Room Air while Ambulating = 92%  

## 2021-10-21 DEATH — deceased
